# Patient Record
Sex: Female | Born: 1985
Health system: Southern US, Community
[De-identification: ages and names within clinical notes are randomized; demographics above are authoritative.]

## PROBLEM LIST (undated history)

## (undated) ENCOUNTER — Emergency Department (HOSPITAL_BASED_OUTPATIENT_CLINIC_OR_DEPARTMENT_OTHER): Admission: EM | Payer: BC Managed Care – PPO | Source: Home / Self Care

---
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
## 2006-01-20 ENCOUNTER — Ambulatory Visit: Payer: Self-pay | Admitting: Internal Medicine

## 2006-01-20 ENCOUNTER — Ambulatory Visit (HOSPITAL_COMMUNITY): Admission: RE | Admit: 2006-01-20 | Discharge: 2006-01-20 | Payer: Self-pay | Admitting: Cardiology

## 2006-01-22 ENCOUNTER — Ambulatory Visit: Payer: Self-pay | Admitting: Cardiology

## 2006-02-16 ENCOUNTER — Ambulatory Visit: Payer: Self-pay | Admitting: Cardiology

## 2006-02-17 ENCOUNTER — Ambulatory Visit: Payer: Self-pay | Admitting: Cardiology

## 2009-08-01 ENCOUNTER — Ambulatory Visit (HOSPITAL_COMMUNITY): Admission: RE | Admit: 2009-08-01 | Discharge: 2009-08-01 | Payer: Self-pay | Admitting: Obstetrics and Gynecology

## 2010-03-02 HISTORY — PX: CERVICAL BIOPSY  W/ LOOP ELECTRODE EXCISION: SUR135

## 2010-05-19 LAB — CBC
HCT: 41 % (ref 36.0–46.0)
Hemoglobin: 14.6 g/dL (ref 12.0–15.0)
MCHC: 35.7 g/dL (ref 30.0–36.0)
MCV: 92.3 fL (ref 78.0–100.0)
Platelets: 143 10*3/uL — ABNORMAL LOW (ref 150–400)
RBC: 4.44 MIL/uL (ref 3.87–5.11)
RDW: 12.2 % (ref 11.5–15.5)
WBC: 4.4 10*3/uL (ref 4.0–10.5)

## 2010-05-19 LAB — PREGNANCY, URINE: Preg Test, Ur: NEGATIVE

## 2010-07-18 NOTE — Assessment & Plan Note (Signed)
[facility]                              CARDIOLOGY OFFICE NOTE   NAME:MABE, GLENIS MUSOLF                       MRN:          [id number]  DATE:11/27/2005                            DOB:          05-Dec-1985    REASON FOR CONSULTATION:  History of supraventricular tachycardia with  symptomatic palpitations.   HISTORY OF PRESENT ILLNESS:  Ms. Phineas Real is a pleasant 25 year old woman,  presently a sophomore at [employer].  She and her mother are present  today for the evaluation.  Ms. Phineas Real has an apparent history of possible  supraventricular tachycardia diagnosed based on symptomatology and, as best  I can tell, has never been objectively documented on any type of monitoring.  She describes symptoms of rapid palpitations associated with dyspnea and  dizziness, but no frank syncope or chest pain that she noted predominantly  when she was a sophomore in high school playing sports.  She actually played  sports from age 66 on, including soccer and basketball, and never had any  particular symptomatology until she was in high school.  She was apparently  told that she had a heart murmur as a child, but has no clearly documented  history of congenital heart disease based on available information.  She  apparently saw a pediatric cardiologist several years ago who diagnosed her  with possible supraventricular tachycardia and she has not been on any  specific medications for this.  She states that over the last year or so she  has had more frequent episodes occurring with less intense levels of  activity, such as walking across campus or walking up steps.  She describes  a fairly onset in rapid palpitations, but a more gradual reduction in  symptoms.  Her electrocardiogram today shows sinus bradycardia with sinus  arrhythmia and ectopic atrial beats suggesting an ectopic atrial focus.  I  do not have an old tracing for comparison.   PAST MEDICAL HISTORY:  As  outlined above.  She has a history of double  hernia repair in 1994.  She had a blocked salivary gland in 1995 and  removal of a cyst in her neck in 1995.  There is no obvious history of  thyroid disease.   SOCIAL HISTORY:  The patient is single.  She denies any tobacco use or  illicit substance use.  She denies any alcohol use.  She does drink  caffeinated beverages, but has cut this back to 1 daily.   FAMILY HISTORY:  Noncontributory for premature cardiovascular disease or  sudden cardiac death.  Her parents are alive and well and she has a sister  age 14 with no reported major medical problems.   REVIEW OF SYSTEMS:  Covered in the history of the present illness.  She does  have some seasonal allergies.  She reports no major symptomatology except  for the above-described episodes.   MEDICATIONS:  Include oral contraceptives.   EXAMINATION:  Blood pressure is 110/60, heart rate is 58, weight is 139  pounds.  This is a normally-nourished-appearing young woman in no acute distress.  HEENT:  Conjunctivae looks normal.  Oropharynx is clear.  NECK:  Supple without elevated jugular venous pressure.  There are no  carotid bruits.  No thyromegaly or thyroid tenderness is noted.  LUNGS:  Clear without labored breathing.  CARDIAC:  Regular rate and rhythm with a very soft basal systolic murmur  that is intermittent.  No pericardial rub or S3 gallop is noted.  ABDOMEN:  Soft with normoactive bowel sounds.  EXTREMITIES:  No significant pitting edema.  SKIN:  Warm and dry.  NEUROPSYCHIATRIC:  The patient is alert and oriented x3.   IMPRESSION/RECOMMENDATIONS:  1. Longstanding description of palpitations, as discussed above.  Symptoms      have been more frequent recently.  She has not had syncope associated      with this.  Her resting electrocardiogram does show some organized      ectopic atrial activity, although this is not rapid.  She has a soft      systolic murmur, which may be  functional.  At this point, our plan is      to provide a CardioNet monitor and try to capture objectively any      specific dysrhythmia, which may require further intervention.  Will      also proceed with a 2D echocardiogram to evaluate cardiac structure and      function.  For the time-being will not treat with any specific medical      therapy.  I will plan to see her back in the office to review these      results.  2. Further plan is to follow.       Jonelle Sidle, MD     SGM/MedQ  DD:  11/27/2005  DT:  11/29/2005  Job #:  [id number]   cc:   Marcelino Duster L. Vincente Poli, M.D.

## 2010-07-18 NOTE — Assessment & Plan Note (Signed)
[facility]                              CARDIOLOGY OFFICE NOTE   NAME:[name], [name]                       MRN:          [id number]  DATE:01/22/2006                            DOB:          [birth year]    REASON FOR VISIT:  Followup cardiac testing.   HISTORY OF PRESENT ILLNESS:  I saw [name] [name] back in late October.  I  referred her at that time for a level 1 cardiopulmonary exercise test.  This  was just recently performed on 11/21, and interpreted by Dr. Gala Romney.  He  and I discussed the results in detail.  She demonstrated mild functional  limitation, compared to matched sedentary normals, with blunted blood  pressure response to exercise, flattened O2-pulse, and a high heart rate/VO2  relationship with elevated VE/VCO2 slope and low ventilatory threshold.  This was suggestive of some type of circulatory limitation with reduced  stroke volume, although the patient did not have chest pain or  electrocardiographic changes consistent with ischemia and, in fact, this  would be quite unusual, unless she had some type of anomalous coronary  anatomy.  The possibility of pulmonary vascular disease is, however, still  to be considered.  I reviewed this with the patient and her mother today.  What we have decided to do next is a contrasted CT scan of the chest to  exclude the possibility of thromboembolic disease.  She is on oral  contraceptives.  I also note, in speaking with the patient's mother, a  family history in the patient's grandfather of hemochromatosis and possibly  coronary vasospasm and in the patient's grandmother of rheumatoid arthritis.  We will check a sed rate, P-ANCA, C-ANCA, rheumatoid factor and ANA, as well  as a CBC and iron level.  If this information is unrevealing, we could  consider the possibility of an exercise echocardiogram with tricuspid  regurgitant velocities, following stress, to exclude exercise-induced  pulmonary  hypertension and ischemia.   ALLERGIES:  No known drug allergies.   PRESENT MEDICATIONS:  Oral contraceptives.   REVIEW OF SYSTEMS:  As described in History of Present Illness.  She has not  had any cough or hemoptysis.  She has had no syncope.   PHYSICAL EXAMINATION:  VITAL SIGNS:  Blood pressure is 120/75, heart rate is  63, weight is 142 pounds.  LUNGS:  Clear without labored breathing.  CARDIAC EXAM:  Reveals a regular rate and rhythm with soft functional  systolic murmur, no S3 gallop.  Second heart sound is normal.  EXTREMITIES:  Show no significant pitting edema.   IMPRESSION/RECOMMENDATIONS:  1. Dyspnea on exertion.  Level 1 cardiopulmonary stress testing was      abnormal, as discussed above.  We will proceed with a contrasted CT      scan of the chest to exclude the possibility of chronic thromboembolic      disease, and also obtain serologies to investigate possible collagen      vascular disease, as well as iron level and complete blood count, given      her grandfather's history of hemochromatosis.  Based on this      information, we can proceed to      the next step.  2. Further plans to follow.     Jonelle Sidle, MD  Electronically Signed    SGM/MedQ  DD: 01/22/2006  DT: 01/22/2006  Job #: [id number]

## 2010-07-18 NOTE — Assessment & Plan Note (Signed)
[facility]                              CARDIOLOGY OFFICE NOTE   NAME:[name], [name] ADEN                       MRN:          [id number]  DATE:12/25/2005                            DOB:          Oct 16, 1985    REASON FOR VISIT:  Follow up cardiac testing.   HISTORY OF PRESENT ILLNESS:  I saw [name] [name] back in late September.  Her  history is outlined in my previous note.  I referred her for an  echocardiogram, which was normal, revealing an ejection fraction of 60% with  no major valvular abnormalities.  Right ventricular size and function were  described as normal as well.  She wore an event recorder, which did not  document any specific dysrhythmias.  She did have some episodes of sinus  tachycardia.  There was nothing to suggest a clear paroxysmal  supraventricular tachycardia.  I spoke with the patient and her mother again  today about symptoms.  She really seems to relate dyspnea on exertion out of  proportion to level of exercise, particularly based on her age.  Apparently  in the past when she was playing sports she would have occasional episodes  of severe breathlessness and feeling of rapid heart rate, although not on  any regular basis.  Now she has more frequent episodes, although of much  less intensity.  She tells me, for instance, that she can walk up 2 flights  of stairs and is apparently very breathless and had to rest when she gets to  the top.  She is not having any chest pain, and has had no syncope.  She is  not aware of any wheezing or diagnosis of asthma.   ALLERGIES:  No known drug allergies.   PRESENT MEDICATIONS:  Oral contraceptives.   REVIEW OF SYSTEMS:  As described in history of present illness.   PHYSICAL EXAMINATION:  Blood pressure 112/86, heart rate 65, weight 138  pounds.  The patient is in no acute distress.  HEENT:  Conjunctivae and lids are normal.  Oropharynx is clear.  NECK:  Supple without elevated jugular  venous pressure or loud bruits.  No  thyromegaly noted.  LUNGS:  Clear without labored breathing.  CARDIAC:  Exam reveals a regular rate and rhythm with very soft functional  systolic murmur, no S3 gallop.  EXTREMITIES:  Showed no significant pitting edema.  SKIN:  Warm and dry.  NEUROPSYCHIATRIC:  The patient is alert and oriented x3, affect is normal.   IMPRESSION AND RECOMMENDATIONS:  1. History of dyspnea on exertion and palpitations, as outlined above.  No      clear dysrhythmia was noted on event recorder, and heart was      structurally normal by echocardiography.  She has had these symptoms      for quite some time, although now they seem to be more frequent but      less intense.  We talked about a number of issues today, and I have      recommended a cardiopulmonary exercise test to evaluate her a bit more  completely from a cardiopulmonary perspective.  This will be arranged,      and I will have her follow up to      discuss the results.  2. Further plans to follow.     Jonelle Sidle, MD    SGM/MedQ  DD: 12/25/2005  DT: 12/27/2005  Job #: [id number]   cc:   Marcelino Duster L. Vincente Poli, M.D.

## 2010-07-18 NOTE — Assessment & Plan Note (Signed)
[facility]                            CARDIOLOGY OFFICE NOTE   Abbey Chatters LAYANI FORONDA                       MRN:          [id number]  DATE:02/17/2006                            DOB:          Nov 08, 1985    REASON FOR VISIT:  Followup testing.   HISTORY OF PRESENT ILLNESS:  I saw Ms. Mabe back in late November.  Her  history is as outlined in my previous note.  Following that visit, I  scheduled her for a contrasted CT scan of the chest to exclude  significant thromboembolic disease and also serologies to investigate  the possibility of collagen vascular disease and hemochromatosis.  She  had all of her blood work and this was quite reassuring.  Her ANA was  normal, as was her CBC.  Her rheumatoid factor was normal, sed rate was  normal, and iron level was normal.  Chest CT was performed and describes  no large or medium-sized pulmonary emboli and no other significant  abnormalities.  I reviewed this with the patient and her mother today.  She has not had any marked change in symptomatology.  We did discuss the  possibility of an exercise echocardiogram to exclude ischemia (unlikely)  and also to obtain a tricuspid regurgitant velocity following exercise  in an effort to evaluate exercise-induced pulmonary hypertension.   She is planning to do some traveling over the next few weeks and, for  the time being, prefers to hold off on testing and we will plan to see  her back in late January, to go on from there.  In the meanwhile, I have  asked her to do some basic walking and other aerobic activity, to see if  deconditioning is at all related to her symptoms.  She and her mother  were comfortable with this.   ALLERGIES:  No known drug allergies.   PRESENT MEDICATIONS:  Oral contraceptives.   REVIEW OF SYSTEMS:  As described in the History of Present Illness.   EXAMINATION:  Blood pressure 120/70, heart rate 80, weight 141 pounds.  No other  significant changes.   IMPRESSION/RECOMMENDATIONS:  1. History of dyspnea on exertion, as outlined previously.  Her      evaluation has been fairly reassuring with the exception of a      finding of mild functional limitation on the cardiopulmonary stress      testing.  Her CT scan argues against thromboembolic disease and her      serologies and iron level argue against collagen vascular disease      or hemochromatosis.  I do think it would be reasonable to do an      exercise echocardiogram to exclude ischemia and also evaluate for      exercise-induced pulmonary hypertension.  She will plan to return      to the office in January and we will discuss this further.  In the      meanwhile, doing some basic      exercise to see if conditioning is related to her symptomatology.  2. Further plans to follow.  Jonelle Sidle, MD  Electronically Signed    SGM/MedQ  DD: 02/17/2006  DT: 02/17/2006  Job #: [id number]

## 2011-05-26 ENCOUNTER — Other Ambulatory Visit: Payer: Self-pay | Admitting: Obstetrics and Gynecology

## 2012-06-16 ENCOUNTER — Other Ambulatory Visit: Payer: Self-pay | Admitting: Obstetrics and Gynecology

## 2012-08-22 ENCOUNTER — Other Ambulatory Visit: Payer: Self-pay | Admitting: Obstetrics and Gynecology

## 2013-01-30 ENCOUNTER — Other Ambulatory Visit: Payer: Self-pay | Admitting: Obstetrics and Gynecology

## 2013-08-29 ENCOUNTER — Emergency Department (HOSPITAL_COMMUNITY)
Admission: EM | Admit: 2013-08-29 | Discharge: 2013-08-29 | Disposition: A | Discharge status: Home / Self Care | Payer: BC Managed Care – PPO | Attending: Emergency Medicine | Admitting: Emergency Medicine

## 2013-08-29 ENCOUNTER — Encounter (HOSPITAL_COMMUNITY): Payer: Self-pay | Admitting: Emergency Medicine

## 2013-08-29 DIAGNOSIS — R197 Diarrhea, unspecified: Secondary | ICD-10-CM | POA: Insufficient documentation

## 2013-08-29 DIAGNOSIS — R5381 Other malaise: Secondary | ICD-10-CM | POA: Insufficient documentation

## 2013-08-29 DIAGNOSIS — R61 Generalized hyperhidrosis: Secondary | ICD-10-CM | POA: Insufficient documentation

## 2013-08-29 DIAGNOSIS — A049 Bacterial intestinal infection, unspecified: Secondary | ICD-10-CM | POA: Insufficient documentation

## 2013-08-29 DIAGNOSIS — Z3202 Encounter for pregnancy test, result negative: Secondary | ICD-10-CM | POA: Insufficient documentation

## 2013-08-29 DIAGNOSIS — R5383 Other fatigue: Secondary | ICD-10-CM

## 2013-08-29 DIAGNOSIS — R110 Nausea: Secondary | ICD-10-CM | POA: Insufficient documentation

## 2013-08-29 LAB — URINE MICROSCOPIC-ADD ON

## 2013-08-29 LAB — URINALYSIS, ROUTINE W REFLEX MICROSCOPIC
Bilirubin Urine: NEGATIVE
GLUCOSE, UA: NEGATIVE mg/dL
Ketones, ur: NEGATIVE mg/dL
Leukocytes, UA: NEGATIVE
Nitrite: NEGATIVE
PH: 6 (ref 5.0–8.0)
PROTEIN: NEGATIVE mg/dL
Specific Gravity, Urine: 1.008 (ref 1.005–1.030)
Urobilinogen, UA: 0.2 mg/dL (ref 0.0–1.0)

## 2013-08-29 LAB — COMPREHENSIVE METABOLIC PANEL
ALBUMIN: 3 g/dL — AB (ref 3.5–5.2)
ALT: 12 U/L (ref 0–35)
AST: 16 U/L (ref 0–37)
Alkaline Phosphatase: 51 U/L (ref 39–117)
BUN: 11 mg/dL (ref 6–23)
CO2: 17 mEq/L — ABNORMAL LOW (ref 19–32)
CREATININE: 0.85 mg/dL (ref 0.50–1.10)
Calcium: 7.9 mg/dL — ABNORMAL LOW (ref 8.4–10.5)
Chloride: 108 mEq/L (ref 96–112)
GFR calc Af Amer: >90 mL/min (ref >90)
GFR calc non Af Amer: >90 mL/min (ref >90)
Glucose, Bld: 127 mg/dL — ABNORMAL HIGH (ref 70–99)
Potassium: 3.5 mEq/L — ABNORMAL LOW (ref 3.7–5.3)
Sodium: 139 mEq/L (ref 137–147)
TOTAL PROTEIN: 6.1 g/dL (ref 6.0–8.3)
Total Bilirubin: 0.7 mg/dL (ref 0.3–1.2)

## 2013-08-29 LAB — CBC WITH DIFFERENTIAL/PLATELET
BASOS ABS: 0 10*3/uL (ref 0.0–0.1)
Basophils Relative: 0 % (ref 0–1)
Eosinophils Absolute: 0 10*3/uL (ref 0.0–0.7)
Eosinophils Relative: 0 % (ref 0–5)
HEMATOCRIT: 33.7 % — AB (ref 36.0–46.0)
Hemoglobin: 11.4 g/dL — ABNORMAL LOW (ref 12.0–15.0)
LYMPHS ABS: 0.4 10*3/uL — AB (ref 0.7–4.0)
Lymphocytes Relative: 9 % — ABNORMAL LOW (ref 12–46)
MCH: 31.7 pg (ref 26.0–34.0)
MCHC: 33.8 g/dL (ref 30.0–36.0)
MCV: 93.6 fL (ref 78.0–100.0)
Monocytes Absolute: 0.3 10*3/uL (ref 0.1–1.0)
Monocytes Relative: 7 % (ref 3–12)
Neutro Abs: 3.8 10*3/uL (ref 1.7–7.7)
Neutrophils Relative %: 84 % — ABNORMAL HIGH (ref 43–77)
PLATELETS: 107 10*3/uL — AB (ref 150–400)
RBC: 3.6 MIL/uL — ABNORMAL LOW (ref 3.87–5.11)
RDW: 13.1 % (ref 11.5–15.5)
WBC MORPHOLOGY: INCREASED
WBC: 4.5 10*3/uL (ref 4.0–10.5)

## 2013-08-29 LAB — PREGNANCY, URINE: PREG TEST UR: NEGATIVE

## 2013-08-29 MED ORDER — CIPROFLOXACIN HCL 500 MG PO TABS: 500.0000 mg | ORAL_TABLET | Freq: Two times a day (BID) | ORAL | Status: DC

## 2013-08-29 MED ORDER — ONDANSETRON 4 MG PO TBDP: ORAL_TABLET | ORAL | Status: DC

## 2013-08-29 MED ORDER — TRAMADOL HCL 50 MG PO TABS
50.0000 mg | ORAL_TABLET | Freq: Once | ORAL | Status: AC
  Administered 2013-08-29: 50 mg via ORAL
  Filled 2013-08-29: qty 1

## 2013-08-29 MED ORDER — ACETAMINOPHEN 500 MG PO TABS
1000.0000 mg | ORAL_TABLET | Freq: Once | ORAL | Status: AC
  Administered 2013-08-29: 1000 mg via ORAL
  Filled 2013-08-29: qty 2

## 2013-08-29 MED ORDER — SODIUM CHLORIDE 0.9 % IV BOLUS (SEPSIS)
1000.0000 mL | Freq: Once | INTRAVENOUS | Status: AC
  Administered 2013-08-29: 1000 mL via INTRAVENOUS

## 2013-08-29 MED ORDER — TRAMADOL HCL 50 MG PO TABS: 50.0000 mg | ORAL_TABLET | Freq: Four times a day (QID) | ORAL | Status: DC | PRN

## 2013-08-29 NOTE — ED Provider Notes (Signed)
CSN: [id number]     Arrival date & time 08/29/13  0153 History   First MD Initiated Contact with Patient 08/29/13 0210     Chief Complaint  Patient presents with  . Fever     (Consider location/radiation/quality/duration/timing/severity/associated sxs/prior Treatment) HPI Patient returned from [place] on Sunday. She started having diarrhea shortly before leaving. She started having fevers and chills when she arrived back in the [place]. She's had multiple watery stools daily. She's had increase bowel sounds but denies any abdominal pain. She complains of generalized headache especially when her fever spikes but then improves when he goes down. She has no neck pain or stiffness. She has no focal weakness or numbness. She hasn't had a mild cough without any sputum production or shortness of breath. Denies any urinary symptoms. She states that she did use ice cubes and tap water to brush her teeth in [place]. No sick contacts. Mild nausea without any vomiting. History reviewed. No pertinent past medical history. History reviewed. No pertinent past surgical history. No family history on file. History  Substance Use Topics  . Smoking status: Never Smoker   . Smokeless tobacco: Not on file  . Alcohol Use: Yes   OB History   Grav Para Term Preterm Abortions TAB SAB Ect Mult Living                 Review of Systems  Constitutional: Positive for fever, chills, diaphoresis and fatigue.  HENT: Negative for congestion, ear pain and sore throat.   Respiratory: Negative for shortness of breath.   Cardiovascular: Negative for chest pain.  Gastrointestinal: Positive for nausea and diarrhea. Negative for vomiting, abdominal pain, constipation and blood in stool.  Genitourinary: Negative for dysuria, frequency and flank pain.  Musculoskeletal: Negative for back pain, neck pain and neck stiffness.  Skin: Negative for rash and wound.  Neurological: Positive for headaches. Negative for dizziness, weakness,  light-headedness and numbness.  All other systems reviewed and are negative.     Allergies  Review of patient's allergies indicates no known allergies.  Home Medications   Prior to Admission medications   Not on File   BP 141/83  Pulse 109  Temp(Src) 101.2 F (38.4 C) (Oral)  Resp 14  SpO2 100% Physical Exam  Nursing note and vitals reviewed. Constitutional: She is oriented to person, place, and time. She appears well-developed and well-nourished. No distress.  HENT:  Head: Normocephalic and atraumatic.  Nose: Nose normal.  Mouth/Throat: Oropharynx is clear and moist. No oropharyngeal exudate.  Right TM is dull  Eyes: EOM are normal. Pupils are equal, round, and reactive to light.  Neck: Normal range of motion. Neck supple. No tracheal deviation present.  No meningismus  Cardiovascular: Normal rate and regular rhythm.   Pulmonary/Chest: Effort normal and breath sounds normal. No respiratory distress. She has no wheezes. She has no rales.  Abdominal: Soft. She exhibits no distension and no mass. There is no tenderness. There is no rebound and no guarding.  Increased bowel sounds.  Musculoskeletal: Normal range of motion. She exhibits no edema and no tenderness.  No flank tenderness  Lymphadenopathy:    She has no cervical adenopathy.  Neurological: She is alert and oriented to person, place, and time.  Patient is alert and oriented x3 with clear, goal oriented speech. Patient has 5/5 motor in all extremities. Sensation is intact to light touch.   Skin: Skin is warm and dry. No rash noted. No erythema.  Psychiatric: She has a normal  mood and affect. Her behavior is normal.    ED Course  Procedures (including critical care time) Labs Review Labs Reviewed  CBC WITH DIFFERENTIAL - Abnormal; Notable for the following:    RBC 3.60 (*)    Hemoglobin 11.4 (*)    HCT 33.7 (*)    All other components within normal limits  COMPREHENSIVE METABOLIC PANEL  URINALYSIS, ROUTINE  W REFLEX MICROSCOPIC  PREGNANCY, URINE    Imaging Review No results found.   EKG Interpretation None      MDM   Final diagnoses:  None    Febrile illness likely gastrointestinal in origin. We'll treat symptomatically and with IV fluids.  Patient's symptoms have improved with IV fluids. She remained hemodynamically stable in the emergency department. Her abdomen is soft and nontender. Likely patient has acquired some type of food born illness. We'll treat with short course of ciprofloxacin and have given return precautions. I have a little suspicion for encephalitis/meningitis. She has a normal neurologic exam and headaches seems to be closely tied to fever.  Loren Raceravid Yelverton, MD 08/29/13 [phone]

## 2013-08-29 NOTE — ED Notes (Signed)
Pt placed in gown and in bed. PT monitored by bp cuff and pulse ox.

## 2013-08-29 NOTE — Discharge Instructions (Signed)
You likely had a foodborne bacteria causing your symptoms. Make sure you wash her hands well. You may alternate Tylenol and Motrin for fever control. Take the antibiotic as prescribed. Return immediately to the emergency department for worsening abdominal pain, headache, neck pain, neck stiffness, persistent vomiting or for any concerns

## 2013-08-29 NOTE — ED Notes (Signed)
Pt. reports fever onset Sunday afternoon with headache , occasional nausea and diarrhea / dry cough .

## 2013-10-02 ENCOUNTER — Other Ambulatory Visit: Payer: Self-pay | Admitting: Obstetrics and Gynecology

## 2013-10-03 LAB — CYTOLOGY - PAP

## 2014-10-22 ENCOUNTER — Other Ambulatory Visit: Payer: Self-pay | Admitting: Obstetrics and Gynecology

## 2014-10-24 LAB — CYTOLOGY - PAP

## 2016-08-31 DIAGNOSIS — M5137 Other intervertebral disc degeneration, lumbosacral region: Secondary | ICD-10-CM | POA: Diagnosis not present

## 2016-08-31 DIAGNOSIS — M791 Myalgia: Secondary | ICD-10-CM | POA: Diagnosis not present

## 2016-08-31 DIAGNOSIS — M545 Low back pain: Secondary | ICD-10-CM | POA: Diagnosis not present

## 2016-08-31 DIAGNOSIS — M5127 Other intervertebral disc displacement, lumbosacral region: Secondary | ICD-10-CM | POA: Diagnosis not present

## 2016-08-31 DIAGNOSIS — M6281 Muscle weakness (generalized): Secondary | ICD-10-CM | POA: Diagnosis not present

## 2016-09-01 DIAGNOSIS — M5137 Other intervertebral disc degeneration, lumbosacral region: Secondary | ICD-10-CM | POA: Diagnosis not present

## 2016-09-01 DIAGNOSIS — M5127 Other intervertebral disc displacement, lumbosacral region: Secondary | ICD-10-CM | POA: Diagnosis not present

## 2016-09-01 DIAGNOSIS — M791 Myalgia: Secondary | ICD-10-CM | POA: Diagnosis not present

## 2016-09-01 DIAGNOSIS — M545 Low back pain: Secondary | ICD-10-CM | POA: Diagnosis not present

## 2016-09-01 DIAGNOSIS — M6281 Muscle weakness (generalized): Secondary | ICD-10-CM | POA: Diagnosis not present

## 2016-09-04 DIAGNOSIS — M6281 Muscle weakness (generalized): Secondary | ICD-10-CM | POA: Diagnosis not present

## 2016-09-04 DIAGNOSIS — M545 Low back pain: Secondary | ICD-10-CM | POA: Diagnosis not present

## 2016-09-04 DIAGNOSIS — M5127 Other intervertebral disc displacement, lumbosacral region: Secondary | ICD-10-CM | POA: Diagnosis not present

## 2016-09-04 DIAGNOSIS — M791 Myalgia: Secondary | ICD-10-CM | POA: Diagnosis not present

## 2016-09-04 DIAGNOSIS — M5137 Other intervertebral disc degeneration, lumbosacral region: Secondary | ICD-10-CM | POA: Diagnosis not present

## 2016-09-07 DIAGNOSIS — M5137 Other intervertebral disc degeneration, lumbosacral region: Secondary | ICD-10-CM | POA: Diagnosis not present

## 2016-09-07 DIAGNOSIS — M791 Myalgia: Secondary | ICD-10-CM | POA: Diagnosis not present

## 2016-09-07 DIAGNOSIS — M6281 Muscle weakness (generalized): Secondary | ICD-10-CM | POA: Diagnosis not present

## 2016-09-07 DIAGNOSIS — M5127 Other intervertebral disc displacement, lumbosacral region: Secondary | ICD-10-CM | POA: Diagnosis not present

## 2016-09-09 DIAGNOSIS — M6281 Muscle weakness (generalized): Secondary | ICD-10-CM | POA: Diagnosis not present

## 2016-09-09 DIAGNOSIS — M5127 Other intervertebral disc displacement, lumbosacral region: Secondary | ICD-10-CM | POA: Diagnosis not present

## 2016-09-09 DIAGNOSIS — M5137 Other intervertebral disc degeneration, lumbosacral region: Secondary | ICD-10-CM | POA: Diagnosis not present

## 2016-09-09 DIAGNOSIS — M791 Myalgia: Secondary | ICD-10-CM | POA: Diagnosis not present

## 2016-09-11 DIAGNOSIS — M6281 Muscle weakness (generalized): Secondary | ICD-10-CM | POA: Diagnosis not present

## 2016-09-11 DIAGNOSIS — M5137 Other intervertebral disc degeneration, lumbosacral region: Secondary | ICD-10-CM | POA: Diagnosis not present

## 2016-09-11 DIAGNOSIS — M5127 Other intervertebral disc displacement, lumbosacral region: Secondary | ICD-10-CM | POA: Diagnosis not present

## 2016-09-11 DIAGNOSIS — M791 Myalgia: Secondary | ICD-10-CM | POA: Diagnosis not present

## 2016-09-14 DIAGNOSIS — M5137 Other intervertebral disc degeneration, lumbosacral region: Secondary | ICD-10-CM | POA: Diagnosis not present

## 2016-09-14 DIAGNOSIS — M5127 Other intervertebral disc displacement, lumbosacral region: Secondary | ICD-10-CM | POA: Diagnosis not present

## 2016-09-14 DIAGNOSIS — M791 Myalgia: Secondary | ICD-10-CM | POA: Diagnosis not present

## 2016-09-14 DIAGNOSIS — M6281 Muscle weakness (generalized): Secondary | ICD-10-CM | POA: Diagnosis not present

## 2016-10-02 DIAGNOSIS — M6281 Muscle weakness (generalized): Secondary | ICD-10-CM | POA: Diagnosis not present

## 2016-10-02 DIAGNOSIS — M5127 Other intervertebral disc displacement, lumbosacral region: Secondary | ICD-10-CM | POA: Diagnosis not present

## 2016-10-02 DIAGNOSIS — M791 Myalgia: Secondary | ICD-10-CM | POA: Diagnosis not present

## 2016-10-02 DIAGNOSIS — M5137 Other intervertebral disc degeneration, lumbosacral region: Secondary | ICD-10-CM | POA: Diagnosis not present

## 2016-10-05 DIAGNOSIS — M791 Myalgia: Secondary | ICD-10-CM | POA: Diagnosis not present

## 2016-10-05 DIAGNOSIS — M5137 Other intervertebral disc degeneration, lumbosacral region: Secondary | ICD-10-CM | POA: Diagnosis not present

## 2016-10-05 DIAGNOSIS — M6281 Muscle weakness (generalized): Secondary | ICD-10-CM | POA: Diagnosis not present

## 2016-10-05 DIAGNOSIS — M5127 Other intervertebral disc displacement, lumbosacral region: Secondary | ICD-10-CM | POA: Diagnosis not present

## 2016-10-08 DIAGNOSIS — M5127 Other intervertebral disc displacement, lumbosacral region: Secondary | ICD-10-CM | POA: Diagnosis not present

## 2016-10-08 DIAGNOSIS — M5137 Other intervertebral disc degeneration, lumbosacral region: Secondary | ICD-10-CM | POA: Diagnosis not present

## 2016-10-08 DIAGNOSIS — M791 Myalgia: Secondary | ICD-10-CM | POA: Diagnosis not present

## 2016-10-08 DIAGNOSIS — M6281 Muscle weakness (generalized): Secondary | ICD-10-CM | POA: Diagnosis not present

## 2016-10-12 DIAGNOSIS — M5127 Other intervertebral disc displacement, lumbosacral region: Secondary | ICD-10-CM | POA: Diagnosis not present

## 2016-10-12 DIAGNOSIS — M791 Myalgia: Secondary | ICD-10-CM | POA: Diagnosis not present

## 2016-10-12 DIAGNOSIS — M5137 Other intervertebral disc degeneration, lumbosacral region: Secondary | ICD-10-CM | POA: Diagnosis not present

## 2016-10-12 DIAGNOSIS — M6281 Muscle weakness (generalized): Secondary | ICD-10-CM | POA: Diagnosis not present

## 2016-12-14 DIAGNOSIS — Z01419 Encounter for gynecological examination (general) (routine) without abnormal findings: Secondary | ICD-10-CM | POA: Diagnosis not present

## 2016-12-14 DIAGNOSIS — Z6828 Body mass index (BMI) 28.0-28.9, adult: Secondary | ICD-10-CM | POA: Diagnosis not present

## 2017-02-17 DIAGNOSIS — N921 Excessive and frequent menstruation with irregular cycle: Secondary | ICD-10-CM | POA: Diagnosis not present

## 2017-04-15 ENCOUNTER — Encounter: Payer: Self-pay | Admitting: Physician Assistant

## 2017-04-15 ENCOUNTER — Ambulatory Visit: Payer: BLUE CROSS/BLUE SHIELD | Admitting: Physician Assistant

## 2017-04-15 VITALS — BP 124/76 | HR 81 | Temp 97.7°F | Resp 18 | Ht 69.0 in | Wt 182.4 lb

## 2017-04-15 DIAGNOSIS — Z0000 Encounter for general adult medical examination without abnormal findings: Secondary | ICD-10-CM

## 2017-04-15 DIAGNOSIS — E559 Vitamin D deficiency, unspecified: Secondary | ICD-10-CM

## 2017-04-15 DIAGNOSIS — Z13220 Encounter for screening for lipoid disorders: Secondary | ICD-10-CM

## 2017-04-15 DIAGNOSIS — Z1329 Encounter for screening for other suspected endocrine disorder: Secondary | ICD-10-CM

## 2017-04-15 DIAGNOSIS — Z6826 Body mass index (BMI) 26.0-26.9, adult: Secondary | ICD-10-CM

## 2017-04-15 DIAGNOSIS — Z79899 Other long term (current) drug therapy: Secondary | ICD-10-CM

## 2017-04-15 NOTE — Progress Notes (Signed)
Complete Physical  Assessment and Plan: [name] [name] was seen today for annual exam.  DiagnoseBarry [name] and all orders for this visit:  BMI 26.0-26.9,adult -     TSH  Vitamin D deficiency -     VITAMIN D 25 Hydroxy (Vit-D Deficiency, Fractures)  Routine general medical examination at a health care facility  Screening cholesterol level -     Lipid panel  Medication management -     CBC with Differential/Platelet -     BASIC METABOLIC PANEL WITH GFR -     Hepatic function panel  Screening for thyroid disorder -     TSH    Discussed med's effects and SE's. Screening labs and tests as requested with regular follow-up as recommended. Over 40 minutes of exam, counseling, chart review, and complex, high level critical decision making was performed this visit.   HPI  32 y.o. female  presents for a complete physical and follow up as new patient without concerns. Her husband, [name] [name] comes here as a patient, newly married.   Her blood pressure has been controlled at home, today their BP is BP: 124/76 She does not workout. She denies chest pain, shortness of breath, dizziness.  She has allergy history, on allegra year round that helps.   Lab Results  Component Value Date   GFRNONAA >90 08/29/2013   BMI is Body mass index is 26.94 kg/m., she is working on diet and exercise. She admits to weight gain with recent dating/marriage. Has lower back pain, going to see chiropractor.   Wt Readings from Last 3 Encounters:  04/15/17 182 lb 6.4 oz (82.7 kg)     Current Medications:  No current outpatient medications on file prior to visit.   No current facility-administered medications on file prior to visit.    Allergies:  No Known Allergies Medical History:  She does not have a problem list on file.  Health Maintenance:   Tetanus: August 2017 Pneumovax: Prevnar 13:  Flu vaccine: declines Zostavax: Has completed HPV  LMP: IUD Pap: Dr. Perlie [name] MGM:  DEXA: Colonoscopy: EGD:  Patient  Care Team: [name] [name], William, MD as PCP - General (Internal Medicine)  Surgical History:  She has a past surgical history that includes Cervical biopsy w/ loop electrode excision (2012) and Hernia repair (1996). Family History:  Herfamily history includes Asthma in her sister; Hypertension in her mother; Kidney Stones in her mother and sister; Migraines in her mother. Social History:  She reports that  has never smoked. she has never used smokeless tobacco. She reports that she drinks alcohol. She reports that she does not use drugs.  Review of Systems: Review of Systems  Constitutional: Negative.  Negative for chills and fever.  HENT: Positive for congestion (improving). Negative for ear pain, sinus pain and sore throat.   Eyes: Negative.   Respiratory: Negative.   Cardiovascular: Negative.   Gastrointestinal: Negative.   Genitourinary: Negative.   Musculoskeletal: Positive for back pain. Negative for falls, joint pain, myalgias and neck pain.  Skin: Negative.   Neurological: Negative.   Endo/Heme/Allergies: Negative.   Psychiatric/Behavioral: Negative.     Physical Exam: Estimated body mass index is 26.94 kg/m as calculated from the following:   Height as of this encounter: 5\' 9"  (1.753 m).   Weight as of this encounter: 182 lb 6.4 oz (82.7 kg). BP 124/76   Pulse 81   Temp 97.7 F (36.5 C)   Resp 18   Ht 5\' 9"  (1.753 m)   Wt 182 lb 6.4  oz (82.7 kg)   SpO2 98%   BMI 26.94 kg/m  General Appearance: Well nourished, in no apparent distress.  Eyes: PERRLA, EOMs, conjunctiva no swelling or erythema, normal fundi and vessels.  Sinuses: No Frontal/maxillary tenderness  ENT/Mouth: Ext aud canals clear, normal light reflex with TMs without erythema, bulging. Good dentition. No erythema, swelling, or exudate on post pharynx. Tonsils not swollen or erythematous. Hearing normal.  Neck: Supple, thyroid normal. No bruits  Respiratory: Respiratory effort normal, BS equal bilaterally  without rales, rhonchi, wheezing or stridor.  Cardio: RRR without murmurs, rubs or gallops. Brisk peripheral pulses without edema.  Chest: symmetric, with normal excursions and percussion.  Breasts: defer GYN Abdomen: Soft, nontender, no guarding, rebound, hernias, masses, or organomegaly.  Lymphatics: Non tender without lymphadenopathy.  Genitourinary: defer GYN Musculoskeletal: Full ROM all peripheral extremities,5/5 strength, and normal gait.  Skin: Warm, dry without rashes, lesions, ecchymosis. Neuro: Cranial nerves intact, reflexes equal bilaterally. Normal muscle tone, no cerebellar symptoms. Sensation intact.  Psych: Awake and oriented X 3, normal affect, Insight and Judgment appropriate.   EKG: defer  Quentin Mulling 11:31 AM [facility]

## 2017-04-15 NOTE — Patient Instructions (Addendum)
Vitamin D goal is between 60-80  Please make sure that you are taking your Vitamin D as directed.   It is very important as a natural anti-inflammatory   helping hair, skin, and nails, as well as reducing stroke and heart attack risk.   It helps your bones and helps with mood.  We want you on at least 5000 IU daily  It also decreases numerous cancer risks so please take it as directed.   Low Vit D is associated with a 200-300% higher risk for CANCER   and 200-300% higher risk for HEART   ATTACK  &  STROKE.    .....................................Marland Kitchen  It is also associated with higher death rate at younger ages,   autoimmune diseases like Rheumatoid arthritis, Lupus, Multiple Sclerosis.     Also many other serious conditions, like depression, Alzheimer's  Dementia, infertility, muscle aches, fatigue, fibromyalgia - just to name a few.  +++++++++++++++++++  Can get liquid vitamin D from [place]  OR here in [place] at  [facility] alternatives [address], [place], [place] [address] Or you can try earth fare     Back pain Rehab Ask your health care provider which exercises are safe for you. Do exercises exactly as told by your health care provider and adjust them as directed. It is normal to feel mild stretching, pulling, tightness, or discomfort as you do these exercises, but you should stop right away if you feel sudden pain or your pain gets worse.Do not begin these exercises until told by your health care provider. Stretching and range of motion exercises These exercises warm up your muscles and joints and improve the movement and flexibility of your hips and your back. These exercises also help to relieve pain, numbness, and tingling. Exercise A: Sciatic nerve glide 1. Sit in a chair with your head facing down toward your chest. Place your hands behind your back. Let your shoulders slump forward. 2. Slowly straighten one of your knees while you tilt your head back as if you  are looking toward the ceiling. Only straighten your leg as far as you can without making your symptoms worse. 3. Hold for __________ seconds. 4. Slowly return to the starting position. 5. Repeat with your other leg. Repeat __________ times. Complete this exercise __________ times a day. Exercise B: Knee to chest with hip adduction and internal rotation  1. Lie on your back on a firm surface with both legs straight. 2. Bend one of your knees and move it up toward your chest until you feel a gentle stretch in your lower back and buttock. Then, move your knee toward the shoulder that is on the opposite side from your leg. ? Hold your leg in this position by holding onto the front of your knee. 3. Hold for __________ seconds. 4. Slowly return to the starting position. 5. Repeat with your other leg. Repeat __________ times. Complete this exercise __________ times a day. Exercise C: Prone extension on elbows  1. Lie on your abdomen on a firm surface. A bed may be too soft for this exercise. 2. Prop yourself up on your elbows. 3. Use your arms to help lift your chest up until you feel a gentle stretch in your abdomen and your lower back. ? This will place some of your body weight on your elbows. If this is uncomfortable, try stacking pillows under your chest. ? Your hips should stay down, against the surface that you are lying on. Keep your hip and back muscles  relaxed. 4. Hold for __________ seconds. 5. Slowly relax your upper body and return to the starting position. Repeat __________ times. Complete this exercise __________ times a day. Strengthening exercises These exercises build strength and endurance in your back. Endurance is the ability to use your muscles for a long time, even after they get tired. Exercise D: Pelvic tilt 1. Lie on your back on a firm surface. Bend your knees and keep your feet flat. 2. Tense your abdominal muscles. Tip your pelvis up toward the ceiling and flatten  your lower back into the floor. ? To help with this exercise, you may place a small towel under your lower back and try to push your back into the towel. 3. Hold for __________ seconds. 4. Let your muscles relax completely before you repeat this exercise. Repeat __________ times. Complete this exercise __________ times a day. Exercise E: Alternating arm and leg raises  1. Get on your hands and knees on a firm surface. If you are on a hard floor, you may want to use padding to cushion your knees, such as an exercise mat. 2. Line up your arms and legs. Your hands should be below your shoulders, and your knees should be below your hips. 3. Lift your left leg behind you. At the same time, raise your right arm and straighten it in front of you. ? Do not lift your leg higher than your hip. ? Do not lift your arm higher than your shoulder. ? Keep your abdominal and back muscles tight. ? Keep your hips facing the ground. ? Do not arch your back. ? Keep your balance carefully, and do not hold your breath. 4. Hold for __________ seconds. 5. Slowly return to the starting position and repeat with your right leg and your left arm. Repeat __________ times. Complete this exercise __________ times a day. Posture and body mechanics  Body mechanics refers to the movements and positions of your body while you do your daily activities. Posture is part of body mechanics. Good posture and healthy body mechanics can help to relieve stress in your body's tissues and joints. Good posture means that your spine is in its natural S-curve position (your spine is neutral), your shoulders are pulled back slightly, and your head is not tipped forward. The following are general guidelines for applying improved posture and body mechanics to your everyday activities. Standing   When standing, keep your spine neutral and your feet about hip-width apart. Keep a slight bend in your knees. Your ears, shoulders, and hips should  line up.  When you do a task in which you stand in one place for a long time, place one foot up on a stable object that is 2-4 inches (5-10 cm) high, such as a footstool. This helps keep your spine neutral. Sitting   When sitting, keep your spine neutral and keep your feet flat on the floor. Use a footrest, if necessary, and keep your thighs parallel to the floor. Avoid rounding your shoulders, and avoid tilting your head forward.  When working at a desk or a computer, keep your desk at a height where your hands are slightly lower than your elbows. Slide your chair under your desk so you are close enough to maintain good posture.  When working at a computer, place your monitor at a height where you are looking straight ahead and you do not have to tilt your head forward or downward to look at the screen. Resting   When lying  down and resting, avoid positions that are most painful for you.  If you have pain with activities such as sitting, bending, stooping, or squatting (flexion-based activities), lie in a position in which your body does not bend very much. For example, avoid curling up on your side with your arms and knees near your chest (fetal position).  If you have pain with activities such as standing for a long time or reaching with your arms (extension-based activities), lie with your spine in a neutral position and bend your knees slightly. Try the following positions: ? Lying on your side with a pillow between your knees. ? Lying on your back with a pillow under your knees. Lifting   When lifting objects, keep your feet at least shoulder-width apart and tighten your abdominal muscles.  Bend your knees and hips and keep your spine neutral. It is important to lift using the strength of your legs, not your back. Do not lock your knees straight out.  Always ask for help to lift heavy or awkward objects. This information is not intended to replace advice given to you by your health  care provider. Make sure you discuss any questions you have with your health care provider. Document Released: 02/16/2005 Document Revised: 10/24/2015 Document Reviewed: 11/02/2014 Elsevier Interactive Patient Education  2018 [employer]   8 Critical Weight-Loss Tips That Aren't Diet and Exercise  1. STARVE THE DISTRACTIONS  All too often when we eat, we're also multitasking: watching TV, answering emails, scrolling through social media. These habits are detrimental to having a strong, clear, healthy relationship with food, and they can hinder our ability to make dietary changes.  In order to truly focus on what you're eating, how much you're eating, why you're eating those specific foods and, most importantly, how those foods make you feel, you need to starve the distractions. That means when you eat, just eat. Focus on your food, the process it went through to end up on your plate, where it came from and how it nourishes you. With this technique, you're more likely to finish a meal feeling satiated.  2.  CONSIDER WHAT YOU'RE NOT WILLING TO DO  This might sound counterintuitive, but it can help provide a "why" when motivation is waning. Declare, in writing, what you are unwilling to do, for example "I am unwilling to be the old dad who cannot play sports with my children".  So consider what you're not willing to accept, write it down, and keep it at the ready.  3.  STOP LABELING FOOD "GOOD" AND "BAD"  You've probably heard someone say they ate something "bad." Maybe you've even said it yourself.  The trouble with 'bad' foods isn't that they'll send you to the grave after a bite or two. The trouble comes when we eat excessive portions of really calorie-dense foods meal after meal, day after day.  Instead of labeling foods as good or bad, think about which foods you can eat a lot of, and which ones you should just eat a little of. Then, plan ways to eat the foods you really like in  portions that fit with your overall goals. A good example of this would be having a slice of pizza alongside a club salad with chicken breast, avocado and a bit of dressing. This is vastly different than 3 slices of pizza, 4 breadsticks with cheese sauce and half of a liter of regular soda.  4.  BRUSH YOUR TEETH AFTER YOU EAT  Getting your  mindset in order is important, but sometimes small habits can make a big difference. After eating, you still have the taste of food in their mouth, which often causes people to eat more even if they are full or engage in a nibble or two of dessert.  Brushing your teeth will remove the taste of food from your mouth, and the clean, minty freshness will serve as a cue that mealtime is over.  5.  FOCUS ON CROWDING NOT CUTTING  The most common first step during 'dieting' is to cut. We cut our portion sizes down, we cut out 'bad' foods, we cut out entire food groups. This act of cutting puts [place] and our minds into scarcity mode.  When something is off-limits, even if you're able to avoid it for a while, you could end up bingeing on it later because you've gone so long without it. So, instead of cutting, focus on crowding. If you crowd your plate and fill it up with more foods like veggies and protein, it simply allows less room for the other stuff. In other words, shift your focus away from what you can't eat, and celebrate the foods that will help you reach your goals.  6.  TAKE TRACKING A STEP FURTHER  Track what you eat, when you ate it, how much you ate and how that food made you feel. Being completely honest with yourself and writing down every single thing that passes through your lips will help you start to notice that maybe you actually do snack, possibly take in more sugar than you thought, eat when you're bored rather than just hungry or maybe that you have a habit of snacking before bed while watching TV.  The difference from simply tracking your food intake is  you're taking into account how food makes you feel, as well as what you're doing while you're eating. This is about becoming more mindful of what, when and why you eat.  7.  PRIORITIZE GOOD SLEEP  One of the strongest risk factors for being overweight is poor sleep. When you're feeling tired, you're more likely to choose unhealthy comfort foods and to skip your workout. Additionally, sleep deprivation may slow down your metabolism. Burnett Kanaris! Therefore, sleeping 7-8 hours per night can help with weight loss without having to change your diet or increase your physical activity. And if you feel you snore and still wake up tired, talk with me about sleep apnea.  8.  SET ASIDE TIME TO DISCONNECT  Just get out there. Disconnect from the electronics and connect to the elements. Not only will this help reduce stress (a major factor in weight gain) by giving your mind a break from the constant stimulation we've all become so accustomed to, but it may also reprogram your brain to connect with yourself and what you're feeling.

## 2017-04-16 LAB — HEPATIC FUNCTION PANEL
AG Ratio: 1.3 (calc) (ref 1.0–2.5)
ALKALINE PHOSPHATASE (APISO): 67 U/L (ref 33–115)
ALT: 16 U/L (ref 6–29)
AST: 20 U/L (ref 10–30)
Albumin: 4.6 g/dL (ref 3.6–5.1)
BILIRUBIN INDIRECT: 0.5 mg/dL (ref 0.2–1.2)
Bilirubin, Direct: 0.1 mg/dL (ref 0.0–0.2)
GLOBULIN: 3.5 g/dL (ref 1.9–3.7)
TOTAL PROTEIN: 8.1 g/dL (ref 6.1–8.1)
Total Bilirubin: 0.6 mg/dL (ref 0.2–1.2)

## 2017-04-16 LAB — CBC WITH DIFFERENTIAL/PLATELET
BASOS PCT: 0.3 %
Basophils Absolute: 20 cells/uL (ref 0–200)
Eosinophils Absolute: 109 cells/uL (ref 15–500)
Eosinophils Relative: 1.6 %
HCT: 40.8 % (ref 35.0–45.0)
Hemoglobin: 14.1 g/dL (ref 11.7–15.5)
Lymphs Abs: 2258 cells/uL (ref 850–3900)
MCH: 30.7 pg (ref 27.0–33.0)
MCHC: 34.6 g/dL (ref 32.0–36.0)
MCV: 88.9 fL (ref 80.0–100.0)
MPV: 12 fL (ref 7.5–12.5)
Monocytes Relative: 5.6 %
NEUTROS PCT: 59.3 %
Neutro Abs: 4032 cells/uL (ref 1500–7800)
PLATELETS: 233 10*3/uL (ref 140–400)
RBC: 4.59 10*6/uL (ref 3.80–5.10)
RDW: 12.7 % (ref 11.0–15.0)
TOTAL LYMPHOCYTE: 33.2 %
WBC mixed population: 381 cells/uL (ref 200–950)
WBC: 6.8 10*3/uL (ref 3.8–10.8)

## 2017-04-16 LAB — BASIC METABOLIC PANEL WITH GFR
BUN: 12 mg/dL (ref 7–25)
CHLORIDE: 108 mmol/L (ref 98–110)
CO2: 24 mmol/L (ref 20–32)
Calcium: 9.7 mg/dL (ref 8.6–10.2)
Creat: 1 mg/dL (ref 0.50–1.10)
GFR, Est African American: 88 mL/min/{1.73_m2} (ref >=60)
GFR, Est Non African American: 76 mL/min/{1.73_m2} (ref >=60)
GLUCOSE: 84 mg/dL (ref 65–99)
POTASSIUM: 4.6 mmol/L (ref 3.5–5.3)
Sodium: 138 mmol/L (ref 135–146)

## 2017-04-16 LAB — LIPID PANEL
CHOLESTEROL: 170 mg/dL (ref <200)
HDL: 45 mg/dL — ABNORMAL LOW (ref >50)
LDL Cholesterol (Calc): 108 mg/dL (calc) — ABNORMAL HIGH
Non-HDL Cholesterol (Calc): 125 mg/dL (calc) (ref <130)
Total CHOL/HDL Ratio: 3.8 (calc) (ref <5.0)
Triglycerides: 78 mg/dL (ref <150)

## 2017-04-16 LAB — TSH: TSH: 0.88 m[IU]/L

## 2017-04-16 LAB — VITAMIN D 25 HYDROXY (VIT D DEFICIENCY, FRACTURES): Vit D, 25-Hydroxy: 37 ng/mL (ref 30–100)

## 2018-01-04 DIAGNOSIS — Z6826 Body mass index (BMI) 26.0-26.9, adult: Secondary | ICD-10-CM | POA: Diagnosis not present

## 2018-01-04 DIAGNOSIS — Z01419 Encounter for gynecological examination (general) (routine) without abnormal findings: Secondary | ICD-10-CM | POA: Diagnosis not present

## 2018-04-25 NOTE — Progress Notes (Signed)
Complete Physical  Assessment and Plan:  BMI 26.0-26.9,adult  Overweight  - long discussion about weight loss, diet, and exercise -recommended diet heavy in fruits and veggies and low in animal meats, cheeses, and dairy products  Vitamin D deficiency -     VITAMIN D 25 Hydroxy (Vit-D Deficiency, Fractures)  Routine general medical examination at a health care facility 1 year  Medication management -     CBC with Differential/Platelet -     COMPLETE METABOLIC PANEL WITH GFR -     Magnesium  Screening cholesterol level -     Lipid panel  Screening for thyroid disorder -     TSH  Screening for hematuria or proteinuria -     Urinalysis, Routine w reflex microscopic -     Microalbumin / creatinine urine ratio  Screening, anemia, deficiency, iron -     Iron,Total/Total Iron Binding Cap -     Vitamin B12  Acute pain of right knee Given information about patellafemoral syndome If not better can refer to ortho   Discussed med's effects and SE's. Screening labs and tests as requested with regular follow-up as recommended. Over 40 minutes of exam, counseling, chart review, and complex, high level critical decision making was performed this visit.   HPI  33 y.o. female  presents for a complete physical and follow up.  Husband is Ray.   She has right knee pain x 2-3 weeks, pain is constant but worse with bending, going up stairs, has popping with discomfort. Some discomfort with turning that knee.  She has been exercising more, no injury. Has tried aleve, tylenol, heat/ice, helps at night for sleeping. No numbness/tingling in leg, pain down the leg, no hip or back pain.   Travel agent.   Her blood pressure has been controlled at home, today their BP is BP: 118/74 She does workout. She denies chest pain, shortness of breath, dizziness.  She has allergy history, on allegra year round that helps.   Lab Results  Component Value Date   GFRNONAA 76 04/15/2017   BMI is Body  mass index is 26.52 kg/m., she is working on diet and exercise.  Has lower back pain, going to see chiropractor.  Wt Readings from Last 3 Encounters:  04/26/18 177 lb (80.3 kg)  04/15/17 182 lb 6.4 oz (82.7 kg)     Current Medications:  No current outpatient medications on file prior to visit.   No current facility-administered medications on file prior to visit.    Allergies:  No Known Allergies   Medical History:  She does not have a problem list on file.  Health Maintenance:   Tetanus: August 2017 Pneumovax: Prevnar 13:  Flu vaccine: declines Zostavax: Has completed HPV  LMP: IUD Pap: Dr. Perlie Gold MGM:  DEXA: Colonoscopy: EGD:  Patient Care Team: Lucky Cowboy, MD as PCP - General (Internal Medicine)  Surgical History:  She has a past surgical history that includes Cervical biopsy w/ loop electrode excision (2012) and Hernia repair (1996). Family History:  Herfamily history includes Asthma in her sister; Hypertension in her mother; Kidney Stones in her mother and sister; Migraines in her mother. Social History:  She reports that she has never smoked. She has never used smokeless tobacco. She reports current alcohol use. She reports that she does not use drugs.  Review of Systems: Review of Systems  Constitutional: Negative.   HENT: Negative.   Eyes: Negative.   Respiratory: Negative.   Cardiovascular: Negative.   Gastrointestinal: Negative.  Genitourinary: Negative.   Musculoskeletal: Positive for joint pain.  Skin: Negative.     Physical Exam: Estimated body mass index is 26.52 kg/m as calculated from the following:   Height as of this encounter: 5' 8.5" (1.74 m).   Weight as of this encounter: 177 lb (80.3 kg). BP 118/74   Pulse 81   Temp 98.1 F (36.7 C)   Ht 5' 8.5" (1.74 m)   Wt 177 lb (80.3 kg)   LMP 04/02/2018   SpO2 98%   BMI 26.52 kg/m  General Appearance: Well nourished, in no apparent distress.  Eyes: PERRLA, EOMs, conjunctiva  no swelling or erythema, normal fundi and vessels.  Sinuses: No Frontal/maxillary tenderness  ENT/Mouth: Ext aud canals clear, normal light reflex with TMs without erythema, bulging. Good dentition. No erythema, swelling, or exudate on post pharynx. Tonsils not swollen or erythematous. Hearing normal.  Neck: Supple, thyroid normal. No bruits  Respiratory: Respiratory effort normal, BS equal bilaterally without rales, rhonchi, wheezing or stridor.  Cardio: RRR without murmurs, rubs or gallops. Brisk peripheral pulses without edema.  Chest: symmetric, with normal excursions and percussion.  Breasts: defer GYN Abdomen: Soft, nontender, no guarding, rebound, hernias, masses, or organomegaly.  Lymphatics: Non tender without lymphadenopathy.  Genitourinary: defer GYN Musculoskeletal: Full ROM all peripheral extremities,5/5 strength, and normal gait. Patient is able to ambulate well.   Gait is not  antalgic. right knee with crepitus and tenderness noted anterior knee, patella laxity noted, no effusion, ACL stable, MCL stable, LCL stable and McMurray's negative Skin: Warm, dry without rashes, lesions, ecchymosis. Neuro: Cranial nerves intact, reflexes equal bilaterally. Normal muscle tone, no cerebellar symptoms. Sensation intact.  Psych: Awake and oriented X 3, normal affect, Insight and Judgment appropriate.   EKG: defer  Quentin Mulling 11:00 AM [facility]

## 2018-04-26 ENCOUNTER — Encounter: Payer: Self-pay | Admitting: Physician Assistant

## 2018-04-26 ENCOUNTER — Ambulatory Visit (INDEPENDENT_AMBULATORY_CARE_PROVIDER_SITE_OTHER): Payer: BLUE CROSS/BLUE SHIELD | Admitting: Physician Assistant

## 2018-04-26 VITALS — BP 118/74 | HR 81 | Temp 98.1°F | Ht 68.5 in | Wt 177.0 lb

## 2018-04-26 DIAGNOSIS — E559 Vitamin D deficiency, unspecified: Secondary | ICD-10-CM

## 2018-04-26 DIAGNOSIS — Z79899 Other long term (current) drug therapy: Secondary | ICD-10-CM | POA: Diagnosis not present

## 2018-04-26 DIAGNOSIS — Z130 Encounter for screening for diseases of the blood and blood-forming organs and certain disorders involving the immune mechanism: Secondary | ICD-10-CM

## 2018-04-26 DIAGNOSIS — Z0000 Encounter for general adult medical examination without abnormal findings: Secondary | ICD-10-CM | POA: Diagnosis not present

## 2018-04-26 DIAGNOSIS — Z1389 Encounter for screening for other disorder: Secondary | ICD-10-CM | POA: Diagnosis not present

## 2018-04-26 DIAGNOSIS — Z6826 Body mass index (BMI) 26.0-26.9, adult: Secondary | ICD-10-CM

## 2018-04-26 DIAGNOSIS — Z1329 Encounter for screening for other suspected endocrine disorder: Secondary | ICD-10-CM

## 2018-04-26 DIAGNOSIS — Z13220 Encounter for screening for lipoid disorders: Secondary | ICD-10-CM

## 2018-04-26 DIAGNOSIS — M25561 Pain in right knee: Secondary | ICD-10-CM

## 2018-04-26 NOTE — Patient Instructions (Addendum)
Patellofemoral Pain Syndrome  Patellofemoral pain syndrome is a condition in which the tissue (cartilage) on the underside of the kneecap (patella) softens or breaks down. This causes pain in the front of the knee. The condition is also called runner's knee or chondromalacia patella. Patellofemoral pain syndrome is most common in young adults who are active in sports. The knee is the largest joint in the body. The patella covers the front of the knee and is attached to muscles above and below the knee. The underside of the patella is covered with a smooth type of cartilage (synovium). The smooth surface helps the patella to glide easily when you move your knee. Patellofemoral pain syndrome causes swelling in the joint linings and bone surfaces in the knee. What are the causes? This condition may be caused by:  Overuse of the knee.  Poor alignment of your knee joints.  Weak leg muscles.  A direct blow to your kneecap. What increases the risk? You are more likely to develop this condition if:  You do a lot of activities that can wear down your kneecap. These include: ? Running. ? Squatting. ? Climbing stairs.  You start a new physical activity or exercise program.  You wear shoes that do not fit well.  You do not have good leg strength.  You are overweight. What are the signs or symptoms? The main symptom of this condition is knee pain. This may feel like a dull, aching pain underneath your patella, in the front of your knee. There may be a popping or cracking sound when you move your knee. Pain may get worse with:  Exercise.  Climbing stairs.  Running.  Jumping.  Squatting.  Kneeling.  Sitting for a long time.  Moving or pushing on your patella. How is this diagnosed? This condition may be diagnosed based on:  Your symptoms and medical history. You may be asked about your recent physical activities and which ones cause knee pain.  A physical exam. This may  include: ? Moving your patella back and forth. ? Checking your range of knee motion. ? Having you squat or jump to see if you have pain. ? Checking the strength of your leg muscles.  Imaging tests to confirm the diagnosis. These may include an MRI of your knee. How is this treated? This condition may be treated at home with rest, ice, compression, and elevation (RICE).  Other treatments may include:  Nonsteroidal anti-inflammatory drugs (NSAIDs).  Physical therapy to stretch and strengthen your leg muscles.  Shoe inserts (orthotics) to take stress off your knee.  A knee brace or knee support.  Adhesive tapes to the skin.  Surgery to remove damaged cartilage or move the patella to a better position. This is rare. Follow these instructions at home: If you have a shoe or brace:  Wear the shoe or brace as told by your health care provider. Remove it only as told by your health care provider.  Loosen the shoe or brace if your toes tingle, become numb, or turn cold and blue.  Keep the shoe or brace clean.  If the shoe or brace is not waterproof: ? Do not let it get wet. ? Cover it with a watertight covering when you take a bath or a shower. Managing pain, stiffness, and swelling  If directed, put ice on the painful area. ? If you have a removable shoe or brace, remove it as told by your health care provider. ? Put ice in a plastic bag. ?  Place a towel between your skin and the bag. ? Leave the ice on for 20 minutes, 2-3 times a day.  Move your toes often to avoid stiffness and to lessen swelling.  Rest your knee: ? Avoid activities that cause knee pain. ? When sitting or lying down, raise (elevate) the injured area above the level of your heart, whenever possible. General instructions  Take over-the-counter and prescription medicines only as told by your health care provider.  Use splints, braces, knee supports, or walking aids as directed by your health care  provider.  Perform stretching and strengthening exercises as told by your health care provider or physical therapist.  Do not use any products that contain nicotine or tobacco, such as cigarettes and e-cigarettes. These can delay healing. If you need help quitting, ask your health care provider.  Return to your normal activities as told by your health care provider. Ask your health care provider what activities are safe for you.  Keep all follow-up visits as told by your health care provider. This is important. Contact a health care provider if:  Your symptoms get worse.  You are not improving with home care. Summary  Patellofemoral pain syndrome is a condition in which the tissue (cartilage) on the underside of the kneecap (patella) softens or breaks down.  This condition causes swelling in the joint linings and bone surfaces in the knee. This leads to pain in the front of the knee.  This condition may be treated at home with rest, ice, compression, and elevation (RICE).  Use splints, braces, knee supports, or walking aids as directed by your health care provider. This information is not intended to replace advice given to you by your health care provider. Make sure you discuss any questions you have with your health care provider. Document Released: 02/04/2009 Document Revised: 03/29/2017 Document Reviewed: 03/29/2017 Elsevier Interactive Patient Education  2019 [employer].  GENERAL HEALTH GOALS  Know what a healthy weight is for you (roughly BMI <25) and aim to maintain this  Aim for 7+ servings of fruits and vegetables daily  70-80+ fluid ounces of water or unsweet tea for healthy kidneys  Limit to max 1 drink of alcohol per day; avoid smoking/tobacco  Limit animal fats in diet for cholesterol and heart health - choose grass fed whenever available  Avoid highly processed foods, and foods high in saturated/trans fats  Aim for low stress - take time to unwind and care  for your mental health  Aim for 150 min of moderate intensity exercise weekly for heart health, and weights twice weekly for bone health  Aim for 7-9 hours of sleep daily

## 2018-04-27 LAB — LIPID PANEL
Cholesterol: 170 mg/dL (ref <200)
HDL: 55 mg/dL (ref >=50)
LDL Cholesterol (Calc): 96 mg/dL (calc)
NON-HDL CHOLESTEROL (CALC): 115 mg/dL (ref <130)
TRIGLYCERIDES: 92 mg/dL (ref <150)
Total CHOL/HDL Ratio: 3.1 (calc) (ref <5.0)

## 2018-04-27 LAB — IRON, TOTAL/TOTAL IRON BINDING CAP
%SAT: 18 % (ref 16–45)
Iron: 61 ug/dL (ref 40–190)
TIBC: 348 mcg/dL (calc) (ref 250–450)

## 2018-04-27 LAB — URINALYSIS, ROUTINE W REFLEX MICROSCOPIC
Bilirubin Urine: NEGATIVE
Glucose, UA: NEGATIVE
HGB URINE DIPSTICK: NEGATIVE
KETONES UR: NEGATIVE
Leukocytes,Ua: NEGATIVE
Nitrite: NEGATIVE
Protein, ur: NEGATIVE
Specific Gravity, Urine: 1.016 (ref 1.001–1.03)
pH: 7 (ref 5.0–8.0)

## 2018-04-27 LAB — CBC WITH DIFFERENTIAL/PLATELET
Absolute Monocytes: 297 cells/uL (ref 200–950)
BASOS ABS: 21 {cells}/uL (ref 0–200)
Basophils Relative: 0.4 %
EOS PCT: 3.9 %
Eosinophils Absolute: 207 cells/uL (ref 15–500)
HCT: 41.1 % (ref 35.0–45.0)
Hemoglobin: 14.4 g/dL (ref 11.7–15.5)
Lymphs Abs: 1749 cells/uL (ref 850–3900)
MCH: 32.6 pg (ref 27.0–33.0)
MCHC: 35 g/dL (ref 32.0–36.0)
MCV: 93 fL (ref 80.0–100.0)
MPV: 13 fL — ABNORMAL HIGH (ref 7.5–12.5)
Monocytes Relative: 5.6 %
NEUTROS ABS: 3026 {cells}/uL (ref 1500–7800)
Neutrophils Relative %: 57.1 %
Platelets: 199 10*3/uL (ref 140–400)
RBC: 4.42 10*6/uL (ref 3.80–5.10)
RDW: 12.5 % (ref 11.0–15.0)
Total Lymphocyte: 33 %
WBC: 5.3 10*3/uL (ref 3.8–10.8)

## 2018-04-27 LAB — COMPLETE METABOLIC PANEL WITH GFR
AG RATIO: 1.4 (calc) (ref 1.0–2.5)
ALKALINE PHOSPHATASE (APISO): 60 U/L (ref 31–125)
ALT: 15 U/L (ref 6–29)
AST: 19 U/L (ref 10–30)
Albumin: 4.6 g/dL (ref 3.6–5.1)
BILIRUBIN TOTAL: 0.6 mg/dL (ref 0.2–1.2)
BUN: 11 mg/dL (ref 7–25)
CO2: 25 mmol/L (ref 20–32)
Calcium: 9.6 mg/dL (ref 8.6–10.2)
Chloride: 106 mmol/L (ref 98–110)
Creat: 0.8 mg/dL (ref 0.50–1.10)
GFR, Est African American: 114 mL/min/{1.73_m2} (ref >=60)
GFR, Est Non African American: 98 mL/min/{1.73_m2} (ref >=60)
GLUCOSE: 92 mg/dL (ref 65–99)
Globulin: 3.3 g/dL (calc) (ref 1.9–3.7)
Potassium: 4 mmol/L (ref 3.5–5.3)
Sodium: 138 mmol/L (ref 135–146)
Total Protein: 7.9 g/dL (ref 6.1–8.1)

## 2018-04-27 LAB — MICROALBUMIN / CREATININE URINE RATIO
Creatinine, Urine: 121 mg/dL (ref 20–275)
Microalb Creat Ratio: 2 mcg/mg creat (ref <30)
Microalb, Ur: 0.3 mg/dL

## 2018-04-27 LAB — VITAMIN B12: Vitamin B-12: 470 pg/mL (ref 200–1100)

## 2018-04-27 LAB — VITAMIN D 25 HYDROXY (VIT D DEFICIENCY, FRACTURES): Vit D, 25-Hydroxy: 34 ng/mL (ref 30–100)

## 2018-04-27 LAB — TSH: TSH: 0.71 mIU/L

## 2018-04-27 LAB — MAGNESIUM: Magnesium: 2 mg/dL (ref 1.5–2.5)

## 2018-11-09 ENCOUNTER — Other Ambulatory Visit: Payer: Self-pay

## 2018-11-09 DIAGNOSIS — Z20822 Contact with and (suspected) exposure to covid-19: Secondary | ICD-10-CM

## 2018-11-09 DIAGNOSIS — Z20828 Contact with and (suspected) exposure to other viral communicable diseases: Secondary | ICD-10-CM | POA: Diagnosis not present

## 2018-11-11 LAB — NOVEL CORONAVIRUS, NAA: SARS-CoV-2, NAA: NOT DETECTED

## 2019-01-12 DIAGNOSIS — G8929 Other chronic pain: Secondary | ICD-10-CM

## 2019-01-13 NOTE — Telephone Encounter (Signed)
Patient was seen in Feb for acute pain of right knee, he has failed outpatient conservative measures and now complains of chronic right knee pain. Will refer to ortho.

## 2019-02-15 DIAGNOSIS — Z6825 Body mass index (BMI) 25.0-25.9, adult: Secondary | ICD-10-CM | POA: Diagnosis not present

## 2019-02-15 DIAGNOSIS — Z01419 Encounter for gynecological examination (general) (routine) without abnormal findings: Secondary | ICD-10-CM | POA: Diagnosis not present

## 2019-02-15 DIAGNOSIS — Z3040 Encounter for surveillance of contraceptives, unspecified: Secondary | ICD-10-CM | POA: Diagnosis not present

## 2019-02-27 ENCOUNTER — Other Ambulatory Visit: Payer: Self-pay

## 2019-03-14 DIAGNOSIS — R87612 Low grade squamous intraepithelial lesion on cytologic smear of cervix (LGSIL): Secondary | ICD-10-CM | POA: Diagnosis not present

## 2019-03-14 DIAGNOSIS — R87619 Unspecified abnormal cytological findings in specimens from cervix uteri: Secondary | ICD-10-CM | POA: Diagnosis not present

## 2019-03-16 DIAGNOSIS — Z30433 Encounter for removal and reinsertion of intrauterine contraceptive device: Secondary | ICD-10-CM | POA: Diagnosis not present

## 2019-04-27 DIAGNOSIS — Z30431 Encounter for routine checking of intrauterine contraceptive device: Secondary | ICD-10-CM | POA: Diagnosis not present

## 2019-05-01 NOTE — Progress Notes (Signed)
Complete Physical  Assessment and Plan:  BMI 26.0-26.9,adult  Overweight  - long discussion about weight loss, diet, and exercise -recommended diet heavy in fruits and veggies and low in animal meats, cheeses, and dairy products  Vitamin D deficiency -     VITAMIN D 25 Hydroxy (Vit-D Deficiency, Fractures)  Routine general medical examination at a health care facility 1 year  Medication management -     CBC with Differential/Platelet -     COMPLETE METABOLIC PANEL WITH GFR -     Magnesium  Screening cholesterol level -     Lipid panel  Screening for thyroid disorder -     TSH  Screening for hematuria or proteinuria -     Urinalysis, Routine w reflex microscopic -     Microalbumin / creatinine urine ratio  Screening, anemia, deficiency, iron -     Iron,Total/Total Iron Binding Cap   Discussed med's effects and SE's. Screening labs and tests as requested with regular follow-up as recommended. Over 40 minutes of exam, counseling, chart review, and complex, high level critical decision making was performed this visit.   HPI  34 y.o. female  presents for a complete physical and follow up.  Husband is Ray.   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   Her blood pressure has been controlled at home, today their BP is BP: 118/72 She does not workout, since the pandemic, declines Pt for knee.  She denies chest pain, shortness of breath, dizziness.  She has allergy history, on allegra year round that helps.   Lab Results  Component Value Date   GFRNONAA 98 04/26/2018   BMI is Body mass index is 26.25 kg/m., she is working on diet and exercise. She has been eating better and not going out due to the pandemic.  Wt Readings from Last 3 Encounters:  05/02/19 175 lb 3.2 oz (79.5 kg)  04/26/18 177 lb (80.3 kg)  04/15/17 182 lb 6.4 oz (82.7 kg)     Current Medications:  No current outpatient medications on file prior to visit.   No current facility-administered medications  on file prior to visit.   Allergies:  No Known Allergies   Medical History:  She does not have a problem list on file.  Health Maintenance:   Tetanus: August 2017 Pneumovax: Prevnar 13:  Flu vaccine: declines Zostavax: Has completed HPV  LMP: IUD Pap: Dr. Candie Mile MGM:  DEXA: Colonoscopy: EGD:  Patient Care Team: Unk Pinto, MD as PCP - General (Internal Medicine)  Surgical History:  She has a past surgical history that includes Cervical biopsy w/ loop electrode excision (2012) and Hernia repair (1996). Family History:  Herfamily history includes Asthma in her sister; Hypertension in her mother; Kidney Stones in her mother and sister; Migraines in her mother. Social History:  She reports that she has never smoked. She has never used smokeless tobacco. She reports current alcohol use. She reports that she does not use drugs.  Review of Systems: Review of Systems  Constitutional: Negative.   HENT: Negative.   Eyes: Negative.   Respiratory: Negative.   Cardiovascular: Negative.   Gastrointestinal: Negative.   Genitourinary: Negative.   Musculoskeletal: Positive for joint pain.  Skin: Negative.     Physical Exam: Estimated body mass index is 26.25 kg/m as calculated from the following:   Height as of this encounter: 5' 8.5" (1.74 m).   Weight as of this encounter: 175 lb 3.2 oz (79.5 kg). BP 118/72   Pulse 62  Temp (!) 97.5 F (36.4 C)   Ht 5' 8.5" (1.74 m)   Wt 175 lb 3.2 oz (79.5 kg)   SpO2 98%   BMI 26.25 kg/m  General Appearance: Well nourished, in no apparent distress.  Eyes: PERRLA, EOMs, conjunctiva no swelling or erythema, normal fundi and vessels.  Sinuses: No Frontal/maxillary tenderness  ENT/Mouth: Ext aud canals clear, normal light reflex with TMs without erythema, bulging. Good dentition. No erythema, swelling, or exudate on post pharynx. Tonsils not swollen or erythematous. Hearing normal.  Neck: Supple, thyroid normal. No bruits   Respiratory: Respiratory effort normal, BS equal bilaterally without rales, rhonchi, wheezing or stridor.  Cardio: RRR without murmurs, rubs or gallops. Brisk peripheral pulses without edema.  Chest: symmetric, with normal excursions and percussion.  Breasts: defer GYN Abdomen: Soft, nontender, no guarding, rebound, hernias, masses, or organomegaly.  Lymphatics: Non tender without lymphadenopathy.  Genitourinary: defer GYN Musculoskeletal: Full ROM all peripheral extremities,5/5 strength, and normal gait. Patient is able to ambulate well.   Gait is not  antalgic. right knee with crepitus and tenderness noted anterior knee, patella laxity noted, no effusion, ACL stable, MCL stable, LCL stable and McMurray's negative Skin: Warm, dry without rashes, lesions, ecchymosis. Neuro: Cranial nerves intact, reflexes equal bilaterally. Normal muscle tone, no cerebellar symptoms. Sensation intact.  Psych: Awake and oriented X 3, normal affect, Insight and Judgment appropriate.   EKG: defer  Quentin Mulling 10:29 AM [facility]

## 2019-05-02 ENCOUNTER — Ambulatory Visit (INDEPENDENT_AMBULATORY_CARE_PROVIDER_SITE_OTHER): Payer: BC Managed Care – PPO | Admitting: Physician Assistant

## 2019-05-02 ENCOUNTER — Encounter: Payer: Self-pay | Admitting: Physician Assistant

## 2019-05-02 ENCOUNTER — Other Ambulatory Visit: Payer: Self-pay

## 2019-05-02 VITALS — BP 118/72 | HR 62 | Temp 97.5°F | Ht 68.5 in | Wt 175.2 lb

## 2019-05-02 DIAGNOSIS — Z0000 Encounter for general adult medical examination without abnormal findings: Secondary | ICD-10-CM | POA: Diagnosis not present

## 2019-05-02 DIAGNOSIS — Z1389 Encounter for screening for other disorder: Secondary | ICD-10-CM

## 2019-05-02 DIAGNOSIS — Z13220 Encounter for screening for lipoid disorders: Secondary | ICD-10-CM

## 2019-05-02 DIAGNOSIS — E559 Vitamin D deficiency, unspecified: Secondary | ICD-10-CM | POA: Diagnosis not present

## 2019-05-02 DIAGNOSIS — Z1329 Encounter for screening for other suspected endocrine disorder: Secondary | ICD-10-CM | POA: Diagnosis not present

## 2019-05-02 DIAGNOSIS — Z79899 Other long term (current) drug therapy: Secondary | ICD-10-CM | POA: Diagnosis not present

## 2019-05-02 DIAGNOSIS — Z130 Encounter for screening for diseases of the blood and blood-forming organs and certain disorders involving the immune mechanism: Secondary | ICD-10-CM

## 2019-05-02 NOTE — Patient Instructions (Addendum)
GENERAL HEALTH GOALS  Know what a healthy weight is for you (roughly BMI <25) and aim to maintain this  Aim for 7+ servings of fruits and vegetables daily  70-80+ fluid ounces of water or unsweet tea for healthy kidneys  Limit to max 1 drink of alcohol per day; avoid smoking/tobacco  Limit animal fats in diet for cholesterol and heart health - choose grass fed whenever available  Avoid highly processed foods, and foods high in saturated/trans fats  Aim for low stress - take time to unwind and care for your mental health  Aim for 150 min of moderate intensity exercise weekly for heart health, and weights twice weekly for bone health  Aim for 7-9 hours of sleep daily  Patellofemoral Pain Syndrome  Patellofemoral pain syndrome is a condition in which the tissue (cartilage) on the underside of the kneecap (patella) softens or breaks down. This causes pain in the front of the knee. The condition is also called runner's knee or chondromalacia patella. Patellofemoral pain syndrome is most common in young adults who are active in sports. The knee is the largest joint in the body. The patella covers the front of the knee and is attached to muscles above and below the knee. The underside of the patella is covered with a smooth type of cartilage (synovium). The smooth surface helps the patella to glide easily when you move your knee. Patellofemoral pain syndrome causes swelling in the joint linings and bone surfaces in the knee. What are the causes? This condition may be caused by:  Overuse of the knee.  Poor alignment of your knee joints.  Weak leg muscles.  A direct blow to your kneecap. What increases the risk? You are more likely to develop this condition if:  You do a lot of activities that can wear down your kneecap. These include: ? Running. ? Squatting. ? Climbing stairs.  You start a new physical activity or exercise program.  You wear shoes that do not fit well.  You do  not have good leg strength.  You are overweight. What are the signs or symptoms? The main symptom of this condition is knee pain. This may feel like a dull, aching pain underneath your patella, in the front of your knee. There may be a popping or cracking sound when you move your knee. Pain may get worse with:  Exercise.  Climbing stairs.  Running.  Jumping.  Squatting.  Kneeling.  Sitting for a long time.  Moving or pushing on your patella. How is this diagnosed? This condition may be diagnosed based on:  Your symptoms and medical history. You may be asked about your recent physical activities and which ones cause knee pain.  A physical exam. This may include: ? Moving your patella back and forth. ? Checking your range of knee motion. ? Having you squat or jump to see if you have pain. ? Checking the strength of your leg muscles.  Imaging tests to confirm the diagnosis. These may include an MRI of your knee. How is this treated? This condition may be treated at home with rest, ice, compression, and elevation (RICE).  Other treatments may include:  Nonsteroidal anti-inflammatory drugs (NSAIDs).  Physical therapy to stretch and strengthen your leg muscles.  Shoe inserts (orthotics) to take stress off your knee.  A knee brace or knee support.  Adhesive tapes to the skin.  Surgery to remove damaged cartilage or move the patella to a better position. This is rare. Follow these  instructions at home: If you have a shoe or brace:  Wear the shoe or brace as told by your health care provider. Remove it only as told by your health care provider.  Loosen the shoe or brace if your toes tingle, become numb, or turn cold and blue.  Keep the shoe or brace clean.  If the shoe or brace is not waterproof: ? Do not let it get wet. ? Cover it with a watertight covering when you take a bath or a shower. Managing pain, stiffness, and swelling  If directed, put ice on the  painful area. ? If you have a removable shoe or brace, remove it as told by your health care provider. ? Put ice in a plastic bag. ? Place a towel between your skin and the bag. ? Leave the ice on for 20 minutes, 2-3 times a day.  Move your toes often to avoid stiffness and to lessen swelling.  Rest your knee: ? Avoid activities that cause knee pain. ? When sitting or lying down, raise (elevate) the injured area above the level of your heart, whenever possible. General instructions  Take over-the-counter and prescription medicines only as told by your health care provider.  Use splints, braces, knee supports, or walking aids as directed by your health care provider.  Perform stretching and strengthening exercises as told by your health care provider or physical therapist.  Do not use any products that contain nicotine or tobacco, such as cigarettes and e-cigarettes. These can delay healing. If you need help quitting, ask your health care provider.  Return to your normal activities as told by your health care provider. Ask your health care provider what activities are safe for you.  Keep all follow-up visits as told by your health care provider. This is important. Contact a health care provider if:  Your symptoms get worse.  You are not improving with home care. Summary  Patellofemoral pain syndrome is a condition in which the tissue (cartilage) on the underside of the kneecap (patella) softens or breaks down.  This condition causes swelling in the joint linings and bone surfaces in the knee. This leads to pain in the front of the knee.  This condition may be treated at home with rest, ice, compression, and elevation (RICE).  Use splints, braces, knee supports, or walking aids as directed by your health care provider. This information is not intended to replace advice given to you by your health care provider. Make sure you discuss any questions you have with your health care  provider. Document Revised: 03/29/2017 Document Reviewed: 03/29/2017 Elsevier Patient Education  2020 [employer].

## 2019-05-03 LAB — COMPLETE METABOLIC PANEL WITH GFR
AG Ratio: 1.4 (calc) (ref 1.0–2.5)
ALT: 17 U/L (ref 6–29)
AST: 19 U/L (ref 10–30)
Albumin: 4.2 g/dL (ref 3.6–5.1)
Alkaline phosphatase (APISO): 57 U/L (ref 31–125)
BUN: 9 mg/dL (ref 7–25)
CO2: 26 mmol/L (ref 20–32)
Calcium: 9.4 mg/dL (ref 8.6–10.2)
Chloride: 108 mmol/L (ref 98–110)
Creat: 0.76 mg/dL (ref 0.50–1.10)
GFR, Est African American: 120 mL/min/{1.73_m2} (ref >=60)
GFR, Est Non African American: 104 mL/min/{1.73_m2} (ref >=60)
Globulin: 3 g/dL (calc) (ref 1.9–3.7)
Glucose, Bld: 89 mg/dL (ref 65–99)
Potassium: 4.5 mmol/L (ref 3.5–5.3)
Sodium: 138 mmol/L (ref 135–146)
Total Bilirubin: 0.6 mg/dL (ref 0.2–1.2)
Total Protein: 7.2 g/dL (ref 6.1–8.1)

## 2019-05-03 LAB — CBC WITH DIFFERENTIAL/PLATELET
Absolute Monocytes: 299 cells/uL (ref 200–950)
Basophils Absolute: 12 cells/uL (ref 0–200)
Basophils Relative: 0.2 %
Eosinophils Absolute: 238 cells/uL (ref 15–500)
Eosinophils Relative: 3.9 %
HCT: 40.3 % (ref 35.0–45.0)
Hemoglobin: 13.9 g/dL (ref 11.7–15.5)
Lymphs Abs: 1696 cells/uL (ref 850–3900)
MCH: 32.3 pg (ref 27.0–33.0)
MCHC: 34.5 g/dL (ref 32.0–36.0)
MCV: 93.5 fL (ref 80.0–100.0)
MPV: 12.2 fL (ref 7.5–12.5)
Monocytes Relative: 4.9 %
Neutro Abs: 3855 cells/uL (ref 1500–7800)
Neutrophils Relative %: 63.2 %
Platelets: 182 10*3/uL (ref 140–400)
RBC: 4.31 10*6/uL (ref 3.80–5.10)
RDW: 11.9 % (ref 11.0–15.0)
Total Lymphocyte: 27.8 %
WBC: 6.1 10*3/uL (ref 3.8–10.8)

## 2019-05-03 LAB — LIPID PANEL
Cholesterol: 184 mg/dL (ref <200)
HDL: 63 mg/dL (ref >=50)
LDL Cholesterol (Calc): 106 mg/dL (calc) — ABNORMAL HIGH
Non-HDL Cholesterol (Calc): 121 mg/dL (calc) (ref <130)
Total CHOL/HDL Ratio: 2.9 (calc) (ref <5.0)
Triglycerides: 67 mg/dL (ref <150)

## 2019-05-03 LAB — VITAMIN D 25 HYDROXY (VIT D DEFICIENCY, FRACTURES): Vit D, 25-Hydroxy: 23 ng/mL — ABNORMAL LOW (ref 30–100)

## 2019-05-03 LAB — MAGNESIUM: Magnesium: 2 mg/dL (ref 1.5–2.5)

## 2019-05-03 LAB — IRON, TOTAL/TOTAL IRON BINDING CAP: Iron: 84 ug/dL (ref 40–190)

## 2019-05-03 LAB — TSH: TSH: 0.97 mIU/L

## 2019-05-03 LAB — FERRITIN: Ferritin: 83 ng/mL (ref 16–154)

## 2019-05-03 LAB — IRON,?TOTAL/TOTAL IRON BINDING CAP
%SAT: 26 % (calc) (ref 16–45)
TIBC: 320 mcg/dL (calc) (ref 250–450)

## 2019-05-03 MED ORDER — VITAMIN D (ERGOCALCIFEROL) 1.25 MG (50000 UNIT) PO CAPS: 50000.0000 [IU] | ORAL_CAPSULE | ORAL | 0 refills | Status: DC

## 2019-06-16 DIAGNOSIS — Z03818 Encounter for observation for suspected exposure to other biological agents ruled out: Secondary | ICD-10-CM | POA: Diagnosis not present

## 2019-06-23 ENCOUNTER — Other Ambulatory Visit: Payer: Self-pay | Admitting: Physician Assistant

## 2019-07-27 DIAGNOSIS — Z20828 Contact with and (suspected) exposure to other viral communicable diseases: Secondary | ICD-10-CM | POA: Diagnosis not present

## 2019-07-27 DIAGNOSIS — Z03818 Encounter for observation for suspected exposure to other biological agents ruled out: Secondary | ICD-10-CM | POA: Diagnosis not present

## 2019-09-14 DIAGNOSIS — R87610 Atypical squamous cells of undetermined significance on cytologic smear of cervix (ASC-US): Secondary | ICD-10-CM | POA: Diagnosis not present

## 2019-09-14 DIAGNOSIS — Z20822 Contact with and (suspected) exposure to covid-19: Secondary | ICD-10-CM | POA: Diagnosis not present

## 2019-09-14 DIAGNOSIS — Z03818 Encounter for observation for suspected exposure to other biological agents ruled out: Secondary | ICD-10-CM | POA: Diagnosis not present

## 2019-09-14 DIAGNOSIS — R87612 Low grade squamous intraepithelial lesion on cytologic smear of cervix (LGSIL): Secondary | ICD-10-CM | POA: Diagnosis not present

## 2019-11-07 DIAGNOSIS — Z03818 Encounter for observation for suspected exposure to other biological agents ruled out: Secondary | ICD-10-CM | POA: Diagnosis not present

## 2019-11-07 DIAGNOSIS — Z20822 Contact with and (suspected) exposure to covid-19: Secondary | ICD-10-CM | POA: Diagnosis not present

## 2019-12-14 DIAGNOSIS — Z20822 Contact with and (suspected) exposure to covid-19: Secondary | ICD-10-CM | POA: Diagnosis not present

## 2019-12-14 DIAGNOSIS — Z03818 Encounter for observation for suspected exposure to other biological agents ruled out: Secondary | ICD-10-CM | POA: Diagnosis not present

## 2020-01-03 DIAGNOSIS — Z03818 Encounter for observation for suspected exposure to other biological agents ruled out: Secondary | ICD-10-CM | POA: Diagnosis not present

## 2020-01-03 DIAGNOSIS — Z20822 Contact with and (suspected) exposure to covid-19: Secondary | ICD-10-CM | POA: Diagnosis not present

## 2020-01-31 DIAGNOSIS — Z20822 Contact with and (suspected) exposure to covid-19: Secondary | ICD-10-CM | POA: Diagnosis not present

## 2020-01-31 DIAGNOSIS — Z03818 Encounter for observation for suspected exposure to other biological agents ruled out: Secondary | ICD-10-CM | POA: Diagnosis not present

## 2020-02-16 DIAGNOSIS — R87610 Atypical squamous cells of undetermined significance on cytologic smear of cervix (ASC-US): Secondary | ICD-10-CM | POA: Diagnosis not present

## 2020-02-16 DIAGNOSIS — Z6825 Body mass index (BMI) 25.0-25.9, adult: Secondary | ICD-10-CM | POA: Diagnosis not present

## 2020-02-16 DIAGNOSIS — Z03818 Encounter for observation for suspected exposure to other biological agents ruled out: Secondary | ICD-10-CM | POA: Diagnosis not present

## 2020-02-16 DIAGNOSIS — Z01419 Encounter for gynecological examination (general) (routine) without abnormal findings: Secondary | ICD-10-CM | POA: Diagnosis not present

## 2020-02-16 DIAGNOSIS — Z20822 Contact with and (suspected) exposure to covid-19: Secondary | ICD-10-CM | POA: Diagnosis not present

## 2020-05-01 ENCOUNTER — Encounter: Payer: Self-pay | Admitting: Adult Health Nurse Practitioner

## 2020-05-01 ENCOUNTER — Other Ambulatory Visit: Payer: Self-pay

## 2020-05-01 ENCOUNTER — Ambulatory Visit (INDEPENDENT_AMBULATORY_CARE_PROVIDER_SITE_OTHER): Payer: BC Managed Care – PPO | Admitting: Adult Health Nurse Practitioner

## 2020-05-01 VITALS — BP 110/70 | HR 67 | Temp 97.5°F | Ht 68.0 in | Wt 172.0 lb

## 2020-05-01 DIAGNOSIS — Z0000 Encounter for general adult medical examination without abnormal findings: Secondary | ICD-10-CM | POA: Diagnosis not present

## 2020-05-01 DIAGNOSIS — Z130 Encounter for screening for diseases of the blood and blood-forming organs and certain disorders involving the immune mechanism: Secondary | ICD-10-CM | POA: Diagnosis not present

## 2020-05-01 DIAGNOSIS — Z1329 Encounter for screening for other suspected endocrine disorder: Secondary | ICD-10-CM

## 2020-05-01 DIAGNOSIS — Z1389 Encounter for screening for other disorder: Secondary | ICD-10-CM

## 2020-05-01 DIAGNOSIS — Z6826 Body mass index (BMI) 26.0-26.9, adult: Secondary | ICD-10-CM

## 2020-05-01 DIAGNOSIS — R7309 Other abnormal glucose: Secondary | ICD-10-CM

## 2020-05-01 DIAGNOSIS — I10 Essential (primary) hypertension: Secondary | ICD-10-CM | POA: Diagnosis not present

## 2020-05-01 DIAGNOSIS — Z136 Encounter for screening for cardiovascular disorders: Secondary | ICD-10-CM

## 2020-05-01 DIAGNOSIS — Z79899 Other long term (current) drug therapy: Secondary | ICD-10-CM

## 2020-05-01 DIAGNOSIS — M25561 Pain in right knee: Secondary | ICD-10-CM

## 2020-05-01 DIAGNOSIS — Z13220 Encounter for screening for lipoid disorders: Secondary | ICD-10-CM | POA: Diagnosis not present

## 2020-05-01 DIAGNOSIS — Z131 Encounter for screening for diabetes mellitus: Secondary | ICD-10-CM | POA: Diagnosis not present

## 2020-05-01 DIAGNOSIS — Z0001 Encounter for general adult medical examination with abnormal findings: Secondary | ICD-10-CM

## 2020-05-01 DIAGNOSIS — E559 Vitamin D deficiency, unspecified: Secondary | ICD-10-CM | POA: Diagnosis not present

## 2020-05-01 DIAGNOSIS — Z1321 Encounter for screening for nutritional disorder: Secondary | ICD-10-CM

## 2020-05-01 DIAGNOSIS — G8929 Other chronic pain: Secondary | ICD-10-CM

## 2020-05-01 LAB — CBC WITH DIFFERENTIAL/PLATELET
Eosinophils Relative: 4.4 %
Lymphs Abs: 1798 cells/uL (ref 850–3900)

## 2020-05-01 NOTE — Progress Notes (Signed)
COMPLETE PHYSICALS  Assessment and Plan:  Diagnoses and all orders for this visit:  Encounter for routine adult medical exam with abnormal findings Yearly  Vitamin D deficiency Taking Vitamin D supplementation at this time  Vitamin D level  Chronic pain of right knee Doing well at this time  Medication management Continued  Screening for thyroid disorder -TSH  Screening, lipid -Lipids  Screening ischemia NSR w/ PAC, no comparison Asymptomatic  Screening for blood or protein in urine -UA  BMI 26.0-26.9 Discussed dietary and exercise modifications    Discussed med's effects and SE's. Screening labs and tests as requested with regular follow-up as recommended. Over 40 minutes of face to face interview, exam, counseling, chart review, and complex, high level critical decision making was performed this visit.   HPI  35 y.o. female  presents for a complete physical and follow up.   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  She I married, husband Ray, no children.  No plans for children at this time.  Her blood pressure has been controlled at home, today their BP is BP: 110/70 She does not workout, since the pandemic, declines Pt for knee.  She denies chest pain, shortness of breath, dizziness.  She has allergy history, on allegra year round that helps.   Lab Results  Component Value Date   GFRNONAA 104 05/02/2019   BMI is Body mass index is 26.15 kg/m., she is working on diet and exercise. She has been eating better and not going out due to the pandemic.  Wt Readings from Last 3 Encounters:  05/01/20 172 lb (78 kg)  05/02/19 175 lb 3.2 oz (79.5 kg)  04/26/18 177 lb (80.3 kg)     Current Medications:  Current Outpatient Medications on File Prior to Visit  Medication Sig Dispense Refill  . Vitamin D, Ergocalciferol, (DRISDOL) 1.25 MG (50000 UNIT) CAPS capsule  TAKE 1 CAPSULE (50,000 UNITS TOTAL) BY MOUTH EVERY 7 (SEVEN) DAYS. 8 capsule 3   No current facility-administered medications on file prior to visit.   Allergies:  No Known Allergies   Medical History:  She does not have a problem list on file.  Health Maintenance:   Tetanus: August 2017 Pneumovax: N/A Prevnar 13: N/A Flu vaccine: declines Zostavax: N/A Has completed HPV  LMP: IUD Pap: Dr. Perlie Gold MGM: N/A DEXA:N/A Colonoscopy: N/A EGD:N/A  Eye exam: lasix surgery Dentist: 03/2020 Patient Care Team: Lucky Cowboy, MD as PCP - General (Internal Medicine)  Surgical History:  She has a past surgical history that includes Cervical biopsy w/ loop electrode excision (2012) and Hernia repair (1996). Family History:  Herfamily history includes Asthma in her sister; Hypertension in her mother; Kidney Stones in her mother and sister; Migraines in her mother. Social History:  She reports that she has never smoked. She has never used smokeless tobacco. She reports current alcohol use. She reports that she does not use drugs.  Review of Systems: Review of Systems  Constitutional: Negative.   HENT: Negative.   Eyes: Negative.   Respiratory: Negative.   Cardiovascular: Negative.   Gastrointestinal: Negative.   Genitourinary: Negative.   Musculoskeletal: Positive for joint pain.  Skin: Negative.     Physical Exam: Estimated body mass index is 26.15 kg/m as calculated from the following:   Height as of this encounter: 5\' 8"  (1.727 m).   Weight as of this encounter: 172  lb (78 kg). BP 110/70   Pulse 67   Temp (!) 97.5 F (36.4 C)   Ht 5\' 8"  (1.727 m)   Wt 172 lb (78 kg)   LMP 04/24/2020   SpO2 98%   BMI 26.15 kg/m  General Appearance: Well nourished, in no apparent distress.  Eyes: PERRLA, EOMs, conjunctiva no swelling or erythema, normal fundi and vessels.  Sinuses: No Frontal/maxillary tenderness  ENT/Mouth: Ext aud canals clear, normal light reflex with TMs  without erythema, bulging. Good dentition. No erythema, swelling, or exudate on post pharynx. Tonsils not swollen or erythematous. Hearing normal.  Neck: Supple, thyroid normal. No bruits  Respiratory: Respiratory effort normal, BS equal bilaterally without rales, rhonchi, wheezing or stridor.  Cardio: RRR without murmurs, rubs or gallops. Brisk peripheral pulses without edema.  Chest: symmetric, with normal excursions and percussion.  Breasts: defer GYN Abdomen: Soft, nontender, no guarding, rebound, hernias, masses, or organomegaly.  Lymphatics: Non tender without lymphadenopathy.  Genitourinary: defer GYN Musculoskeletal: Full ROM all peripheral extremities,5/5 strength, and normal gait. Patient is able to ambulate well.   Gait is not  antalgic. right knee with crepitus and tenderness noted anterior knee, patella laxity noted, no effusion, ACL stable, MCL stable, LCL stable and McMurray's negative Skin: Warm, dry without rashes, lesions, ecchymosis. Neuro: Cranial nerves intact, reflexes equal bilaterally. Normal muscle tone, no cerebellar symptoms. Sensation intact.  Psych: Awake and oriented X 3, normal affect, Insight and Judgment appropriate.   EKG: defer  Kyre Jeffries 10:40 AM [facility]

## 2020-05-02 ENCOUNTER — Other Ambulatory Visit: Payer: Self-pay | Admitting: Adult Health Nurse Practitioner

## 2020-05-02 DIAGNOSIS — E559 Vitamin D deficiency, unspecified: Secondary | ICD-10-CM

## 2020-05-02 LAB — COMPLETE METABOLIC PANEL WITH GFR
AG Ratio: 1.3 (calc) (ref 1.0–2.5)
ALT: 11 U/L (ref 6–29)
AST: 19 U/L (ref 10–30)
Albumin: 4.3 g/dL (ref 3.6–5.1)
Alkaline phosphatase (APISO): 58 U/L (ref 31–125)
BUN: 15 mg/dL (ref 7–25)
CO2: 27 mmol/L (ref 20–32)
Calcium: 9.3 mg/dL (ref 8.6–10.2)
Chloride: 105 mmol/L (ref 98–110)
Creat: 0.86 mg/dL (ref 0.50–1.10)
GFR, Est African American: 103 mL/min/{1.73_m2} (ref >=60)
GFR, Est Non African American: 89 mL/min/{1.73_m2} (ref >=60)
Globulin: 3.3 g/dL (calc) (ref 1.9–3.7)
Glucose, Bld: 81 mg/dL (ref 65–99)
Potassium: 4.6 mmol/L (ref 3.5–5.3)
Sodium: 139 mmol/L (ref 135–146)
Total Bilirubin: 0.8 mg/dL (ref 0.2–1.2)
Total Protein: 7.6 g/dL (ref 6.1–8.1)

## 2020-05-02 LAB — CBC WITH DIFFERENTIAL/PLATELET
Absolute Monocytes: 308 cells/uL (ref 200–950)
Basophils Absolute: 22 cells/uL (ref 0–200)
Basophils Relative: 0.4 %
Eosinophils Absolute: 238 {cells}/uL (ref 15–500)
HCT: 41.5 % (ref 35.0–45.0)
Hemoglobin: 14.7 g/dL (ref 11.7–15.5)
MCH: 33.7 pg — ABNORMAL HIGH (ref 27.0–33.0)
MCHC: 35.4 g/dL (ref 32.0–36.0)
MCV: 95.2 fL (ref 80.0–100.0)
MPV: 12.4 fL (ref 7.5–12.5)
Monocytes Relative: 5.7 %
Neutro Abs: 3035 cells/uL (ref 1500–7800)
Neutrophils Relative %: 56.2 %
Platelets: 193 Thousand/uL (ref 140–400)
RBC: 4.36 10*6/uL (ref 3.80–5.10)
RDW: 11.8 % (ref 11.0–15.0)
Total Lymphocyte: 33.3 %
WBC: 5.4 Thousand/uL (ref 3.8–10.8)

## 2020-05-02 LAB — URINALYSIS W MICROSCOPIC + REFLEX CULTURE
Bacteria, UA: NONE SEEN /HPF
Bilirubin Urine: NEGATIVE
Glucose, UA: NEGATIVE
Hgb urine dipstick: NEGATIVE
Hyaline Cast: NONE SEEN /LPF
Leukocyte Esterase: NEGATIVE
Nitrites, Initial: NEGATIVE
Protein, ur: NEGATIVE
RBC / HPF: NONE SEEN /HPF (ref 0–2)
Specific Gravity, Urine: 1.02 (ref 1.001–1.03)
WBC, UA: NONE SEEN /HPF (ref 0–5)
pH: 6 (ref 5.0–8.0)

## 2020-05-02 LAB — HEMOGLOBIN A1C
Hgb A1c MFr Bld: 4.4 % of total Hgb (ref <5.7)
Mean Plasma Glucose: 80 mg/dL
eAG (mmol/L): 4.4 mmol/L

## 2020-05-02 LAB — IRON, TOTAL/TOTAL IRON BINDING CAP
%SAT: 21 % (calc) (ref 16–45)
Iron: 66 ug/dL (ref 40–190)
TIBC: 308 mcg/dL (calc) (ref 250–450)

## 2020-05-02 LAB — LIPID PANEL
Cholesterol: 180 mg/dL (ref <200)
HDL: 53 mg/dL (ref >=50)
LDL Cholesterol (Calc): 110 mg/dL (calc) — ABNORMAL HIGH
Non-HDL Cholesterol (Calc): 127 mg/dL (calc) (ref <130)
Total CHOL/HDL Ratio: 3.4 (calc) (ref <5.0)
Triglycerides: 83 mg/dL (ref <150)

## 2020-05-02 LAB — VITAMIN D 25 HYDROXY (VIT D DEFICIENCY, FRACTURES): Vit D, 25-Hydroxy: 25 ng/mL — ABNORMAL LOW (ref 30–100)

## 2020-05-02 LAB — VITAMIN B12: Vitamin B-12: 256 pg/mL (ref 200–1100)

## 2020-05-02 LAB — TSH: TSH: 0.77 mIU/L

## 2020-05-02 LAB — NO CULTURE INDICATED

## 2020-05-02 MED ORDER — CHOLECALCIFEROL 1.25 MG (50000 UT) PO CAPS: ORAL_CAPSULE | ORAL | 0 refills | Status: DC

## 2020-06-14 DIAGNOSIS — Z20822 Contact with and (suspected) exposure to covid-19: Secondary | ICD-10-CM | POA: Diagnosis not present

## 2020-06-14 DIAGNOSIS — Z03818 Encounter for observation for suspected exposure to other biological agents ruled out: Secondary | ICD-10-CM | POA: Diagnosis not present

## 2020-07-11 DIAGNOSIS — Z20822 Contact with and (suspected) exposure to covid-19: Secondary | ICD-10-CM | POA: Diagnosis not present

## 2020-07-11 DIAGNOSIS — Z03818 Encounter for observation for suspected exposure to other biological agents ruled out: Secondary | ICD-10-CM | POA: Diagnosis not present

## 2020-08-13 ENCOUNTER — Emergency Department (HOSPITAL_BASED_OUTPATIENT_CLINIC_OR_DEPARTMENT_OTHER)
Admission: EM | Admit: 2020-08-13 | Discharge: 2020-08-13 | Disposition: A | Discharge status: Home / Self Care | Payer: BC Managed Care – PPO | Attending: Emergency Medicine | Admitting: Emergency Medicine

## 2020-08-13 ENCOUNTER — Other Ambulatory Visit: Payer: Self-pay

## 2020-08-13 ENCOUNTER — Ambulatory Visit (HOSPITAL_COMMUNITY)
Admission: EM | Admit: 2020-08-13 | Discharge: 2020-08-13 | Disposition: A | Discharge status: Home / Self Care | Payer: BC Managed Care – PPO

## 2020-08-13 ENCOUNTER — Emergency Department (HOSPITAL_BASED_OUTPATIENT_CLINIC_OR_DEPARTMENT_OTHER): Payer: BC Managed Care – PPO

## 2020-08-13 ENCOUNTER — Encounter (HOSPITAL_BASED_OUTPATIENT_CLINIC_OR_DEPARTMENT_OTHER): Payer: Self-pay

## 2020-08-13 ENCOUNTER — Encounter (HOSPITAL_COMMUNITY): Payer: Self-pay | Admitting: Emergency Medicine

## 2020-08-13 DIAGNOSIS — R0602 Shortness of breath: Secondary | ICD-10-CM | POA: Diagnosis not present

## 2020-08-13 DIAGNOSIS — K2900 Acute gastritis without bleeding: Secondary | ICD-10-CM | POA: Insufficient documentation

## 2020-08-13 DIAGNOSIS — R509 Fever, unspecified: Secondary | ICD-10-CM | POA: Diagnosis not present

## 2020-08-13 DIAGNOSIS — Z20822 Contact with and (suspected) exposure to covid-19: Secondary | ICD-10-CM | POA: Insufficient documentation

## 2020-08-13 DIAGNOSIS — R1013 Epigastric pain: Secondary | ICD-10-CM | POA: Diagnosis not present

## 2020-08-13 DIAGNOSIS — R1110 Vomiting, unspecified: Secondary | ICD-10-CM | POA: Diagnosis not present

## 2020-08-13 LAB — CBC
HCT: 39.9 % (ref 36.0–46.0)
Hemoglobin: 13.7 g/dL (ref 12.0–15.0)
MCH: 32.2 pg (ref 26.0–34.0)
MCHC: 34.3 g/dL (ref 30.0–36.0)
MCV: 93.9 fL (ref 80.0–100.0)
Platelets: 163 10*3/uL (ref 150–400)
RBC: 4.25 MIL/uL (ref 3.87–5.11)
RDW: 12.7 % (ref 11.5–15.5)
WBC: 10 10*3/uL (ref 4.0–10.5)
nRBC: 0 % (ref 0.0–0.2)

## 2020-08-13 LAB — URINALYSIS, ROUTINE W REFLEX MICROSCOPIC
Bilirubin Urine: NEGATIVE
Glucose, UA: NEGATIVE mg/dL
Hgb urine dipstick: NEGATIVE
Ketones, ur: NEGATIVE mg/dL
Nitrite: NEGATIVE
Specific Gravity, Urine: 1.029 (ref 1.005–1.030)
pH: 8 (ref 5.0–8.0)

## 2020-08-13 LAB — COMPREHENSIVE METABOLIC PANEL
ALT: 22 U/L (ref 0–44)
AST: 27 U/L (ref 15–41)
Albumin: 4.3 g/dL (ref 3.5–5.0)
Alkaline Phosphatase: 52 U/L (ref 38–126)
Anion gap: 11 (ref 5–15)
BUN: 13 mg/dL (ref 6–20)
CO2: 21 mmol/L — ABNORMAL LOW (ref 22–32)
Calcium: 9.4 mg/dL (ref 8.9–10.3)
Chloride: 107 mmol/L (ref 98–111)
Creatinine, Ser: 0.93 mg/dL (ref 0.44–1.00)
GFR, Estimated: >60 mL/min (ref >60)
Glucose, Bld: 126 mg/dL — ABNORMAL HIGH (ref 70–99)
Potassium: 4.1 mmol/L (ref 3.5–5.1)
Sodium: 139 mmol/L (ref 135–145)
Total Bilirubin: 0.9 mg/dL (ref 0.3–1.2)
Total Protein: 7.9 g/dL (ref 6.5–8.1)

## 2020-08-13 LAB — LIPASE, BLOOD: Lipase: 34 U/L (ref 11–51)

## 2020-08-13 LAB — RESP PANEL BY RT-PCR (FLU A&B, COVID) ARPGX2
Influenza A by PCR: NEGATIVE
Influenza B by PCR: NEGATIVE
SARS Coronavirus 2 by RT PCR: NEGATIVE

## 2020-08-13 LAB — PREGNANCY, URINE: Preg Test, Ur: NEGATIVE

## 2020-08-13 LAB — TROPONIN I (HIGH SENSITIVITY)
Troponin I (High Sensitivity): <2 ng/L (ref <18)
Troponin I (High Sensitivity): <2 ng/L (ref <18)

## 2020-08-13 MED ORDER — IOHEXOL 300 MG/ML  SOLN
100.0000 mL | Freq: Once | INTRAMUSCULAR | Status: AC | PRN
  Administered 2020-08-13: 100 mL via INTRAVENOUS

## 2020-08-13 MED ORDER — ONDANSETRON HCL 4 MG PO TABS: 4.0000 mg | ORAL_TABLET | Freq: Four times a day (QID) | ORAL | 0 refills | Status: DC

## 2020-08-13 MED ORDER — SODIUM CHLORIDE 0.9 % IV BOLUS
1000.0000 mL | Freq: Once | INTRAVENOUS | Status: AC
  Administered 2020-08-13: 1000 mL via INTRAVENOUS

## 2020-08-13 MED ORDER — PANTOPRAZOLE SODIUM 20 MG PO TBEC: 20.0000 mg | DELAYED_RELEASE_TABLET | Freq: Every day | ORAL | 0 refills | Status: DC

## 2020-08-13 MED ORDER — MORPHINE SULFATE (PF) 4 MG/ML IV SOLN
4.0000 mg | Freq: Once | INTRAVENOUS | Status: AC
  Administered 2020-08-13: 4 mg via INTRAVENOUS
  Filled 2020-08-13: qty 1

## 2020-08-13 MED ORDER — ALUM & MAG HYDROXIDE-SIMETH 200-200-20 MG/5ML PO SUSP
30.0000 mL | Freq: Once | ORAL | Status: DC
  Filled 2020-08-13: qty 30

## 2020-08-13 MED ORDER — LIDOCAINE VISCOUS HCL 2 % MT SOLN
15.0000 mL | Freq: Once | OROMUCOSAL | Status: DC
  Filled 2020-08-13: qty 15

## 2020-08-13 MED ORDER — ONDANSETRON HCL 4 MG/2ML IJ SOLN
4.0000 mg | Freq: Once | INTRAMUSCULAR | Status: AC
  Administered 2020-08-13: 4 mg via INTRAVENOUS
  Filled 2020-08-13: qty 2

## 2020-08-13 NOTE — ED Notes (Signed)
After reviewing with patient and Dr. Chaney Malling, patient decided best plan was to go to ER for full work up.    Patient is being discharged from the Urgent Care and sent to the Emergency Department via private vehicle. Per Dr. Chaney Malling, patient is in need of higher level of care due to work up with imaging expected. Patient is aware and verbalizes understanding of plan of care.  Vitals:   08/13/20 1647  BP: 118/70  Pulse: 98  Resp: 18  Temp: 99.4 F (37.4 C)  SpO2: 99%

## 2020-08-13 NOTE — ED Triage Notes (Signed)
Patient presents to [facility] for evaluation of epigastric pain starting this morning around 1030am.  States the pain continued to worsen, and she had an episode of emesis.  Developed a fever, got up to 103.  Patient states she laid down for a nap and pain has worsened when she woke up.  Patient states she has had three soft bowel movements today, denies diarrhea, denies nausea.

## 2020-08-13 NOTE — ED Triage Notes (Addendum)
Pt c/o epigastric pain that radiates to the back. Pt describes the pain as sharp. Pt has associated N/V, loose stool, fever with TMax of 102.8. Pt states that eating makes the epigastric pain worse.

## 2020-08-13 NOTE — ED Provider Notes (Signed)
[facility] EMERGENCY DEPT Provider Note   CSN: [id number] Arrival date & time: 08/13/20  1709     History Chief Complaint  Patient presents with   Abdominal Pain    [name] [name] is a 35 y.o. female.  HPI     35 year old female presents with concern for epigastric pain with radiation to the back and fever.  Reports that her symptoms began this morning.  Describes it as a sharp pain in her upper abdomen that comes and goes, and radiates to the back.  She reports that the episodes that she had were after eating.  It is severe pain.  She had temperature today of nearly 103.  She has had nausea, vomiting, loose stool.  Reports the pain is located in her lower chest/upper abdomen.  She denies any associated shortness of breath or cough.  No known sick contacts.  Denies urinary symptoms, vaginal discharge or bleeding.  Denies tick bites, IV drug use, rash.  History reviewed. No pertinent past medical history.  There are no problems to display for this patient.   Past Surgical History:  Procedure Laterality Date   CERVICAL BIOPSY  W/ LOOP ELECTRODE EXCISION  2012   HERNIA REPAIR  1996     OB History   No obstetric history on file.     Family History  Problem Relation Age of Onset   Hypertension Mother    Migraines Mother    Kidney Stones Mother    Kidney Stones Sister    Asthma Sister     Social History   Tobacco Use   Smoking status: Never   Smokeless tobacco: Never  Vaping Use   Vaping Use: Never used  Substance Use Topics   Alcohol use: Yes    Comment: rare   Drug use: No    Home Medications Prior to Admission medications   Medication Sig Start Date End Date Taking? Authorizing Provider  ondansetron (ZOFRAN) 4 MG tablet Take 1 tablet (4 mg total) by mouth every 6 (six) hours. 08/13/20  Yes Alvira Monday, MD  pantoprazole (PROTONIX) 20 MG tablet Take 1 tablet (20 mg total) by mouth daily for 14 days. 08/13/20 08/27/20 Yes Alvira Monday,  MD  Cholecalciferol 1.25 MG (50000 UT) capsule Take one tablet by mouth three days a week for twelve weeks. 05/02/20   Elder Negus, NP  levonorgestrel (KYLEENA) 19.5 MG IUD Kyleena 17.5 mcg/24 hrs (64yrs) 19.5mg  intrauterine device  Take 1 device by intrauterine route.    [provider]  Multiple Vitamins-Minerals (MULTIVITAMIN GUMMIES ADULT PO) Take 2 tablets by mouth daily.    [provider]    Allergies    Patient has no known allergies.  Review of Systems   Review of Systems  Constitutional:  Positive for appetite change and fever.  HENT:  Negative for sore throat.   Eyes:  Negative for visual disturbance.  Respiratory:  Negative for cough and shortness of breath.   Cardiovascular:  Positive for chest pain.  Gastrointestinal:  Positive for abdominal pain, nausea and vomiting.  Genitourinary:  Negative for difficulty urinating.  Musculoskeletal:  Negative for back pain and neck pain.  Skin:  Negative for rash.  Neurological:  Negative for syncope and headaches.   Physical Exam Updated Vital Signs BP 107/78   Pulse 80   Temp 99.4 F (37.4 C)   Resp (!) 23   LMP 07/24/2020   SpO2 100%   Physical Exam Vitals and nursing note reviewed.  Constitutional:  General: She is not in acute distress.    Appearance: She is well-developed. She is not diaphoretic.  HENT:     Head: Normocephalic and atraumatic.  Eyes:     Conjunctiva/sclera: Conjunctivae normal.  Cardiovascular:     Rate and Rhythm: Normal rate and regular rhythm.     Heart sounds: Normal heart sounds. No murmur heard.   No friction rub. No gallop.  Pulmonary:     Effort: Pulmonary effort is normal. No respiratory distress.     Breath sounds: Normal breath sounds. No wheezing or rales.  Abdominal:     General: There is no distension.     Palpations: Abdomen is soft.     Tenderness: There is no abdominal tenderness. There is no guarding.  Musculoskeletal:        General: No tenderness.      Cervical back: Normal range of motion.  Skin:    General: Skin is warm and dry.     Findings: No erythema or rash.  Neurological:     Mental Status: She is alert and oriented to person, place, and time.    ED Results / Procedures / Treatments   Labs (all labs ordered are listed, but only abnormal results are displayed) Labs Reviewed  COMPREHENSIVE METABOLIC PANEL - Abnormal; Notable for the following components:      Result Value   CO2 21 (*)    Glucose, Bld 126 (*)    All other components within normal limits  URINALYSIS, ROUTINE W REFLEX MICROSCOPIC - Abnormal; Notable for the following components:   Protein, ur TRACE (*)    Leukocytes,Ua SMALL (*)    All other components within normal limits  RESP PANEL BY RT-PCR (FLU A&B, COVID) ARPGX2  LIPASE, BLOOD  CBC  PREGNANCY, URINE  TROPONIN I (HIGH SENSITIVITY)  TROPONIN I (HIGH SENSITIVITY)    EKG None  Radiology CT ABDOMEN PELVIS W CONTRAST  Result Date: 08/13/2020 CLINICAL DATA:  Gastric pain with nausea and vomiting EXAM: CT ABDOMEN AND PELVIS WITH CONTRAST TECHNIQUE: Multidetector CT imaging of the abdomen and pelvis was performed using the standard protocol following bolus administration of intravenous contrast. CONTRAST:  OMNIPAQUE IOHEXOL 300 MG/ML  SOLN COMPARISON:  None. FINDINGS: Lower chest: Lung bases are clear. Hepatobiliary: No focal liver lesions are appreciable. Gallbladder wall is not appreciably thickened. There is no biliary duct dilatation. Pancreas: There is no pancreatic mass or inflammatory focus. Spleen: No splenic lesions are evident. Adrenals/Urinary Tract: Adrenals bilaterally appear normal. There is a cyst along the lateral lower pole left kidney measuring 5 x 5 mm. There is a 3 mm cyst in the mid left kidney laterally. There is no evident hydronephrosis on either side. There is no appreciable renal or ureteral calculus on either side. Urinary bladder is midline with wall thickness within normal  limits. Stomach/Bowel: There is no appreciable bowel wall or mesenteric thickening. No evident bowel obstruction. Terminal ileum appears normal. Appendix appears normal. Note that the appendix is in the midline of the upper pelvis. No free air or portal venous air. Vascular/Lymphatic: No abdominal aortic aneurysm. No arterial vascular lesions are evident. Major venous structures appear patent. There is no evident adenopathy in the abdomen or pelvis. Reproductive: The uterus is anteverted. There is an intrauterine device positioned within the endometrium. There is a collapsed cyst within the left ovary measuring 1.5 x 1.0 cm. No other adnexal mass. No free free pelvic fluid. Other: No evident abscess or ascites in the abdomen or pelvis.  Musculoskeletal: No blastic or lytic bone lesions. No abdominal wall or intramuscular lesions are evident. IMPRESSION: 1. Small collapsed cyst in left ovary without surrounding fluid. No other adnexal mass. 2.  Intrauterine device within endometrium. 3. No bowel wall thickening or bowel obstruction. No abscess in the abdomen or pelvis. Appendix appears normal. 4. No renal or ureteral calculus. No hydronephrosis. Urinary bladder wall thickness normal. Electronically Signed   By: Bretta Bang III M.D.   On: 08/13/2020 19:19   DG Chest Portable 1 View  Result Date: 08/13/2020 CLINICAL DATA:  Shortness of breath EXAM: PORTABLE CHEST 1 VIEW COMPARISON:  Chest CT February 16, 2006 FINDINGS: Lungs are clear. Heart size and pulmonary vascularity are normal. No adenopathy. Pneumothorax. No bone lesions. IMPRESSION: Lungs clear.  Cardiac silhouette normal. Electronically Signed   By: Bretta Bang III M.D.   On: 08/13/2020 19:14    Procedures Procedures   Medications Ordered in ED Medications  alum & mag hydroxide-simeth (MAALOX/MYLANTA) 200-200-20 MG/5ML suspension 30 mL (30 mLs Oral Patient Refused/Not Given 08/13/20 2057)    And  lidocaine (XYLOCAINE) 2 % viscous mouth  solution 15 mL (15 mLs Oral Patient Refused/Not Given 08/13/20 2057)  sodium chloride 0.9 % bolus 1,000 mL (0 mLs Intravenous Stopped 08/13/20 2007)  ondansetron (ZOFRAN) injection 4 mg (4 mg Intravenous Given 08/13/20 1902)  morphine 4 MG/ML injection 4 mg (4 mg Intravenous Given 08/13/20 1902)  iohexol (OMNIPAQUE) 300 MG/ML solution 100 mL (100 mLs Intravenous Contrast Given 08/13/20 1848)    ED Course  I have reviewed the triage vital signs and the nursing notes.  Pertinent labs & imaging results that were available during my care of the patient were reviewed by me and considered in my medical decision making (see chart for details).    MDM Rules/Calculators/A&P                           35 year old female presents with concern for epigastric pain with radiation to the back and fever.   CT abdomen pelvis shows no evidence of cholecystitis, appendicitis, diverticulitis, other intra-abdominal abscess.  Urinalysis is not consistent with pyelonephritis.  Labs do not show sign of pancreatitis.  Chest x-ray shows no evidence of pneumonia.  Delta troponins were negative, no sign of myocarditis, pericarditis or ACS.  Discussed that symptomatic cholelithiasis is possible, but would not explain her fever, and would expect to see some sort of inflammation on CT scan or tenderness on exam if cholecystitis was the etiology.  Description of pain is more central epigastric. CT completed given severity of pain described and fever.   Suspect likely gastritis and viral syndrome. COVID testing is negative.  Recommend continued supportive care, zofran, PPI, discussed reasons to return.    Final Clinical Impression(s) / ED Diagnoses Final diagnoses:  Epigastric pain  Fever, unspecified fever cause  Acute gastritis without hemorrhage, unspecified gastritis type    Rx / DC Orders ED Discharge Orders          Ordered    ondansetron (ZOFRAN) 4 MG tablet  Every 6 hours        08/13/20 2054    pantoprazole  (PROTONIX) 20 MG tablet  Daily        08/13/20 2054             Alvira Monday, MD 08/13/20 2359

## 2020-09-25 NOTE — Progress Notes (Signed)
Assessment and Plan:  [name] [name] was seen today for acute visit and knee pain.  Diagnoses and all orders for this visit:  Right upper quadrant abdominal pain -     US Abdomen Limited RUQ (LIVER/GB); Future - Monitor symptoms, if gallbladder U/S is normal would recommend observation as pain is very sporadic at this point.  If any changes she is to call the office.   Acute pain of right knee -     DG Knee Complete 4 Views Right; Future - Pt advised to wear knee brace when doing bootcamp as this exercise class is hard on the knee joint.  To use NSAIDs and ice for pain. Will base follow up treatment pending Xray result.   Sebaceous cyst       - Discussed I&D versus warm compresses and monitoring.  Pt decides to monitor at this point.  If grows in size, becomes tender or does not resolve she will return to the office.   Further disposition pending results of labs. Discussed med's effects and SE's.   Over 30 minutes of exam, counseling, chart review, and critical decision making was performed.   Future Appointments  Date Time Provider Department Center  05/01/2021 10:00 AM Revonda Humphrey, NP [facility] None    ------------------------------------------------------------------------------------------------------------------   HPI BP 102/78   Pulse (!) 56   Temp (!) 97.5 F (36.4 C)   Wt 168 lb 6.4 oz (76.4 kg)   SpO2 97%   BMI 25.61 kg/m  34 y.o.female presents for follow up after  ER visit  08/13/2020 for abdominal pain in upper quardrants with nausea and diarrhea.  Had a negative abdominal CT.  Currently having no nausea, vomiting, diarrhea or constipation but has intermittent abdominal bloating and some abdominal discomfort in upper right quadrant.    Right knee crepitus and pain with movement /flexion x 3 years but appears to be worsening.   When she walks up the stairs she hears loud clicking. When she is doing lunges she has increased pain in front and top of knee. Played volleyball in  high school does not remember injury. Pt does do burn bootcamp 5 days a week.   Pt has small knot on top of head which has been present several months. Nontender, no drainage. Had a similar area on scalp previously which spontaneously resolved.      No Known Allergies   Current Outpatient Medications on File Prior to Visit  Medication Sig   Cholecalciferol 1.25 MG (50000 UT) capsule Take one tablet by mouth three days a week for twelve weeks.   levonorgestrel (KYLEENA) 19.5 MG IUD Kyleena 17.5 mcg/24 hrs (67yrs) 19.5mg  intrauterine device  Take 1 device by intrauterine route.   Multiple Vitamins-Minerals (MULTIVITAMIN GUMMIES ADULT PO) Take 2 tablets by mouth daily.   ondansetron (ZOFRAN) 4 MG tablet Take 1 tablet (4 mg total) by mouth every 6 (six) hours.   pantoprazole (PROTONIX) 20 MG tablet Take 1 tablet (20 mg total) by mouth daily for 14 days.   No current facility-administered medications on file prior to visit.    ROS: all negative except above.   Physical Exam:  BP 102/78   Pulse (!) 56   Temp (!) 97.5 F (36.4 C)   Wt 168 lb 6.4 oz (76.4 kg)   SpO2 97%   BMI 25.61 kg/m   General Appearance: Well nourished, in no apparent distress. Eyes: PERRLA, EOMs, conjunctiva no swelling or erythema Sinuses: No Frontal/maxillary tenderness ENT/Mouth: Ext aud canals clear, TMs  without erythema, bulging. No erythema, swelling, or exudate on post pharynx.  Tonsils not swollen or erythematous. Hearing normal.  Neck: Supple, thyroid normal.  Respiratory: Respiratory effort normal, BS equal bilaterally without rales, rhonchi, wheezing or stridor.  Cardio: RRR with no MRGs. Brisk peripheral pulses without edema.  Abdomen: Soft, + BS.  Non tender, no guarding, rebound, hernias, masses. Lymphatics: Non tender without lymphadenopathy.  Musculoskeletal: Full ROM, 5/5 strength, normal gait. Right knee loud clicking and crepitus with extension and flexion Skin: 1 cm sebaceous cyst noted on  scalp, no erythema or drainage Neuro: Cranial nerves intact. Normal muscle tone, no cerebellar symptoms. Sensation intact.  Psych: Awake and oriented X 3, normal affect, Insight and Judgment appropriate.     Revonda Humphrey, NP 4:34 PM [facility]

## 2020-09-26 ENCOUNTER — Other Ambulatory Visit: Payer: Self-pay

## 2020-09-26 ENCOUNTER — Encounter: Payer: Self-pay | Admitting: Nurse Practitioner

## 2020-09-26 ENCOUNTER — Ambulatory Visit (INDEPENDENT_AMBULATORY_CARE_PROVIDER_SITE_OTHER): Payer: BC Managed Care – PPO | Admitting: Nurse Practitioner

## 2020-09-26 VITALS — BP 102/78 | HR 56 | Temp 97.5°F | Wt 168.4 lb

## 2020-09-26 DIAGNOSIS — R1011 Right upper quadrant pain: Secondary | ICD-10-CM | POA: Diagnosis not present

## 2020-09-26 DIAGNOSIS — M25561 Pain in right knee: Secondary | ICD-10-CM | POA: Diagnosis not present

## 2020-09-26 DIAGNOSIS — L723 Sebaceous cyst: Secondary | ICD-10-CM

## 2020-09-26 NOTE — Patient Instructions (Signed)
[facility] [address], [place], [place] [address]  ~5.1 mi [phone] [email or web address] Open 24 hours  Acute Knee Pain, Adult Acute knee pain is sudden and may be caused by damage, swelling, or irritation of the muscles and tissues that support the knee. Pain may result from: A fall. An injury to the knee from twisting motions. A hit to the knee. Infection. Acute knee pain may go away on its own with time and rest. If it does not, your health care provider may order tests to find the cause of the pain. These may include: Imaging tests, such as an X-ray, MRI, CT scan, or ultrasound. Joint aspiration. In this test, fluid is removed from the knee and evaluated. Arthroscopy. In this test, a lighted tube is inserted into the knee and an image is projected onto a TV screen. Biopsy. In this test, a sample of tissue is removed from the body and studied under a microscope. Follow these instructions at home: If you have a knee sleeve or brace:  Wear the knee sleeve or brace as told by your health care provider. Remove it only as told by your health care provider. Loosen it if your toes tingle, become numb, or turn cold and blue. Keep it clean. If the knee sleeve or brace is not waterproof: Do not let it get wet. Cover it with a watertight covering when you take a bath or shower.  Activity Rest your knee. Do not do things that cause pain or make pain worse. Avoid high-impact activities or exercises, such as running, jumping rope, or doing jumping jacks. Work with a physical therapist to make a safe exercise program, as recommended by your health care provider. Do exercises as told by your physical therapist. Managing pain, stiffness, and swelling  If directed, put ice on the affected knee. To do this: If you have a removable knee sleeve or brace, remove it as told by your health care provider. Put ice in a plastic bag. Place a towel between  your skin and the bag. Leave the ice on for 20 minutes, 2-3 times a day. Remove the ice if your skin turns bright red. This is very important. If you cannot feel pain, heat, or cold, you have a greater risk of damage to the area. If directed, use an elastic bandage to put pressure (compression) on your injured knee. This may control swelling, give support, and help with discomfort. Raise (elevate) your knee above the level of your heart while you are sitting or lying down. Sleep with a pillow under your knee.  General instructions Take over-the-counter and prescription medicines only as told by your health care provider. Do not use any products that contain nicotine or tobacco, such as cigarettes, e-cigarettes, and chewing tobacco. If you need help quitting, ask your health care provider. If you are overweight, work with your health care provider and a dietitian to set a weight-loss goal that is healthy and reasonable for you. Extra weight can put pressure on your knee. Pay attention to any changes in your symptoms. Keep all follow-up visits. This is important. Contact a health care provider if: Your knee pain continues, changes, or gets worse. You have a fever along with knee pain. Your knee feels warm to the touch or is red. Your knee buckles or locks up. Get help right away if: Your knee swells, and the swelling becomes worse. You cannot move your knee. You have severe pain in your knee that  cannot be managed with pain medicine. Summary Acute knee pain can be caused by a fall, an injury, an infection, or damage, swelling, or irritation of the tissues that support your knee. Your health care provider may perform tests to find out the cause of the pain. Pay attention to any changes in your symptoms. Relieve your pain with rest, medicines, light activity, and the use of ice. Get help right away if your knee swells, you cannot move your knee, or you have severe pain that cannot be managed  with medicine. This information is not intended to replace advice given to you by your health care provider. Make sure you discuss any questions you have with your healthcare provider. Document Revised: 08/02/2019 Document Reviewed: 08/02/2019 Elsevier Patient Education  2022 [employer].

## 2020-10-10 ENCOUNTER — Other Ambulatory Visit: Payer: Self-pay

## 2020-10-10 ENCOUNTER — Ambulatory Visit (HOSPITAL_BASED_OUTPATIENT_CLINIC_OR_DEPARTMENT_OTHER)
Admission: RE | Admit: 2020-10-10 | Discharge: 2020-10-10 | Disposition: A | Discharge status: Home / Self Care | Payer: BC Managed Care – PPO | Source: Ambulatory Visit | Attending: Nurse Practitioner | Admitting: Nurse Practitioner

## 2020-10-10 DIAGNOSIS — M25561 Pain in right knee: Secondary | ICD-10-CM | POA: Insufficient documentation

## 2020-10-10 DIAGNOSIS — R1011 Right upper quadrant pain: Secondary | ICD-10-CM

## 2020-10-15 ENCOUNTER — Other Ambulatory Visit: Payer: Self-pay | Admitting: Nurse Practitioner

## 2020-10-15 DIAGNOSIS — M25561 Pain in right knee: Secondary | ICD-10-CM

## 2020-10-31 DIAGNOSIS — M25561 Pain in right knee: Secondary | ICD-10-CM | POA: Diagnosis not present

## 2020-11-13 DIAGNOSIS — M2241 Chondromalacia patellae, right knee: Secondary | ICD-10-CM | POA: Diagnosis not present

## 2021-05-01 ENCOUNTER — Encounter: Payer: BC Managed Care – PPO | Admitting: Adult Health Nurse Practitioner

## 2021-05-01 ENCOUNTER — Encounter: Payer: BC Managed Care – PPO | Admitting: Nurse Practitioner

## 2021-05-13 NOTE — Progress Notes (Deleted)
COMPLETE PHYSICAL ? ?Assessment and Plan: ? ?Diagnoses and all orders for this visit: ? ?Encounter for routine adult medical exam with abnormal findings ?Yearly ? ?Vitamin D deficiency ?Taking Vitamin D supplementation at this time ? Vitamin D level ? ?Chronic pain of right knee ?Doing well at this time ? ?Medication management ?Continued ? ?Screening for thyroid disorder ?-TSH ? ?Hyperlipidemia, unspecified ?Continue diet and exercise ?Check Lipid panel ? ?Abnormal Glucose ?Continue diet and Exercise ?- A1c ? ?Screening for blood or protein in urine ?-UA Routine with reflex microscopic ?- Microalbumin/creatinine ratio ? ?BMI 26.0-26.9 ?Discussed dietary and exercise modifications ? ? ? ?Discussed med's effects and SE's. Screening labs and tests as requested with regular follow-up as recommended. ?Over 40 minutes of face to face interview, exam, counseling, chart review, and complex, high level critical decision making was performed this visit.  ? ?HPI  ?36 y.o. female  presents for a complete physical and follow up.  ? ?LMP: 04/24/20, has IUD 03/2019 at last GYN visit ? 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  She I married, husband [name] [name], no children.  No plans for children at this time. ? ?Her blood pressure has been controlled at home, today their BP is   ?She does not workout, since the pandemic, declines Pt for knee. ? She denies chest pain, shortness of breath, dizziness.  ?She has allergy history, on allegra year round that helps.  ? ?Lab Results  ?Component Value Date  ? GFRNONAA >60 08/13/2020  ? ?BMI is There is no height or weight on file to calculate BMI., she is working on diet and exercise. She has been eating better and not going out due to the pandemic.  ?Wt Readings from Last 3 Encounters:  ?09/26/20 168 lb 6.4 oz (76.4 kg)  ?05/01/20 172 lb (78 kg)  ?05/02/19 175 lb 3.2 oz (79.5 kg)  ? ? ? ?Current Medications:  ?Current Outpatient Medications on File  Prior to Visit  ?Medication Sig Dispense Refill  ? Cholecalciferol 1.25 MG (50000 UT) capsule Take one tablet by mouth three days a week for twelve weeks. 36 capsule 0  ? levonorgestrel (KYLEENA) 19.5 MG IUD Kyleena 17.5 mcg/24 hrs (60yrs) 19.5mg  intrauterine device ? Take 1 device by intrauterine route.    ? Multiple Vitamins-Minerals (MULTIVITAMIN GUMMIES ADULT PO) Take 2 tablets by mouth daily.    ? ?No current facility-administered medications on file prior to visit.  ? ?Allergies:  ?No Known Allergies  ? ?Medical History:  ?She has Right upper quadrant abdominal pain; Acute pain of right knee; and Sebaceous cyst on their problem list. ? ?Health Maintenance:   ?Tetanus: August 2017 ?Pneumovax: N/A ?Prevnar 13: N/A ?Flu vaccine: declines ?Zostavax: N/A ?Has completed HPV ? ?LMP: IUD ?Pap: Dr. Perlie Gold ?MGM: N/A ?DEXA:N/A ?Colonoscopy: N/A ?EGD:N/A ? ?Eye exam: lasix surgery ?Dentist: 03/2020 ?Patient Care Team: ?Lucky Cowboy, MD as PCP - General (Internal Medicine) ? ?Surgical History:  ?She has a past surgical history that includes Cervical biopsy w/ loop electrode excision (2012) and Hernia repair (1996). ?Family History:  ?Herfamily history includes Asthma in her sister; Hypertension in her mother; Kidney Stones in her mother and sister; Migraines in her mother. ?Social History:  ?She reports that she has never smoked. She has never used smokeless tobacco. She reports current alcohol use. She reports that she does not use drugs. ? ?Review of Systems: ?Review of Systems  ?Constitutional: Negative.  Negative for chills and  fever.  ?HENT: Negative.  Negative for congestion, hearing loss, sinus pain, sore throat and tinnitus.   ?Eyes: Negative.  Negative for blurred vision and double vision.  ?Respiratory: Negative.  Negative for cough, hemoptysis, sputum production, shortness of breath and wheezing.   ?Cardiovascular: Negative.  Negative for chest pain, palpitations and leg swelling.  ?Gastrointestinal:  Negative.  Negative for abdominal pain, constipation, diarrhea, heartburn, nausea and vomiting.  ?Genitourinary: Negative.  Negative for dysuria and urgency.  ?Musculoskeletal:  Positive for joint pain. Negative for back pain, falls, myalgias and neck pain.  ?Skin: Negative.  Negative for rash.  ?Neurological:  Negative for dizziness, tingling, tremors, weakness and headaches.  ?Endo/Heme/Allergies:  Does not bruise/bleed easily.  ?Psychiatric/Behavioral:  Negative for depression and suicidal ideas. The patient is not nervous/anxious and does not have insomnia.   ? ?Physical Exam: ?Estimated body mass index is 25.61 kg/m? as calculated from the following: ?  Height as of 05/01/20: 5\' 8"  (1.727 m). ?  Weight as of 09/26/20: 168 lb 6.4 oz (76.4 kg). ?There were no vitals taken for this visit. ?General Appearance: Well nourished, in no apparent distress.  ?Eyes: PERRLA, EOMs, conjunctiva no swelling or erythema, normal fundi and vessels.  ?Sinuses: No Frontal/maxillary tenderness  ?ENT/Mouth: Ext aud canals clear, normal light reflex with TMs without erythema, bulging. Good dentition. No erythema, swelling, or exudate on post pharynx. Tonsils not swollen or erythematous. Hearing normal.  ?Neck: Supple, thyroid normal. No bruits  ?Respiratory: Respiratory effort normal, BS equal bilaterally without rales, rhonchi, wheezing or stridor.  ?Cardio: RRR without murmurs, rubs or gallops. Brisk peripheral pulses without edema.  ?Chest: symmetric, with normal excursions and percussion.  ?Breasts: defer GYN ?Abdomen: Soft, nontender, no guarding, rebound, hernias, masses, or organomegaly.  ?Lymphatics: Non tender without lymphadenopathy.  ?Genitourinary: defer GYN ?Musculoskeletal: Full ROM all peripheral extremities,5/5 strength, and normal gait. Patient is able to ambulate well.   ?Gait is not  antalgic. ?right knee with crepitus and tenderness noted anterior knee, patella laxity noted, no effusion, ACL stable, MCL stable, LCL  stable and McMurray's negative ?Skin: Warm, dry without rashes, lesions, ecchymosis. ?Neuro: Cranial nerves intact, reflexes equal bilaterally. Normal muscle tone, no cerebellar symptoms. Sensation intact.  ?Psych: Awake and oriented X 3, normal affect, Insight and Judgment appropriate.  ? ?EKG: defer ? ?[name] [name] W Sovereign Ramiro ?1:13 PM ?[facility] ? ?

## 2021-05-14 ENCOUNTER — Encounter: Payer: 59 | Admitting: Nurse Practitioner

## 2021-05-14 DIAGNOSIS — R7309 Other abnormal glucose: Secondary | ICD-10-CM

## 2021-05-14 DIAGNOSIS — E785 Hyperlipidemia, unspecified: Secondary | ICD-10-CM

## 2021-05-14 DIAGNOSIS — Z0001 Encounter for general adult medical examination with abnormal findings: Secondary | ICD-10-CM

## 2021-05-14 DIAGNOSIS — Z1329 Encounter for screening for other suspected endocrine disorder: Secondary | ICD-10-CM

## 2021-05-14 DIAGNOSIS — Z79899 Other long term (current) drug therapy: Secondary | ICD-10-CM

## 2021-05-14 DIAGNOSIS — Z1389 Encounter for screening for other disorder: Secondary | ICD-10-CM

## 2021-05-14 DIAGNOSIS — E559 Vitamin D deficiency, unspecified: Secondary | ICD-10-CM

## 2021-05-14 DIAGNOSIS — G8929 Other chronic pain: Secondary | ICD-10-CM

## 2021-05-28 NOTE — Progress Notes (Signed)
COMPLETE PHYSICAL ? ?Assessment and Plan: ? ?Diagnoses and all orders for this visit: ? ?Encounter for routine adult medical exam with abnormal findings ?Yearly ? ?Vitamin D deficiency ?Taking Vitamin D supplementation at this time ? Vitamin D level ? ?Chronic pain of right knee ?Doing well at this time ? ?Medication management ?Magnesium ? ?Screening for thyroid disorder ?-TSH ? ?Hyperlipidemia, unspecified ?Continue diet and exercise ?Check Lipid panel ?- CMP ? ?Abnormal Glucose ?Continue diet and Exercise ?- A1c ? ?Screening for Anemia ?- CBC ? ?Screening for blood or protein in urine ?-UA Routine with reflex microscopic ?- Microalbumin/creatinine ratio ? ?BMI 23.0-23.9 ?Discussed dietary and exercise modifications ? ? ? ?Discussed med's effects and SE's. Screening labs and tests as requested with regular follow-up as recommended. ?Over 40 minutes of face to face interview, exam, counseling, chart review, and complex, high level critical decision making was performed this visit.  ? ?HPI  ?36 y.o. female  presents for a complete physical and follow up.  ? ?LMP: 04/24/20, has IUD 03/2019 at last GYN visit ? 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  She I married, husband Ray, no children.  No plans for children at this time. ? ?Her blood pressure has been controlled at home, today their BP is BP: 108/70  ?BP Readings from Last 3 Encounters:  ?05/30/21 108/70  ?09/26/20 102/78  ?08/13/20 107/78  ? ? ?She does not workout, since the pandemic, declines Pt for knee. ? She denies chest pain, shortness of breath, dizziness.  ?She has allergy history, on allegra year round that helps.  ? ?Lab Results  ?Component Value Date  ? GFRNONAA >60 08/13/2020  ? ?BMI is Body mass index is 23.35 kg/m?., she is working on diet and exercise. She has been eating better and exercising more ?Wt Readings from Last 3 Encounters:  ?05/30/21 157 lb (71.2 kg)  ?09/26/20 168 lb 6.4 oz (76.4 kg)   ?05/01/20 172 lb (78 kg)  ? ? ? ?Current Medications:  ?Current Outpatient Medications on File Prior to Visit  ?Medication Sig Dispense Refill  ? levonorgestrel (KYLEENA) 19.5 MG IUD Kyleena 17.5 mcg/24 hrs (59yrs) 19.5mg  intrauterine device ? Take 1 device by intrauterine route.    ? Multiple Vitamins-Minerals (MULTIVITAMIN GUMMIES ADULT PO) Take 2 tablets by mouth daily.    ? Cholecalciferol 1.25 MG (50000 UT) capsule Take one tablet by mouth three days a week for twelve weeks. 36 capsule 0  ? ?No current facility-administered medications on file prior to visit.  ? ?Allergies:  ?No Known Allergies  ? ?Medical History:  ?She has Right upper quadrant abdominal pain; Acute pain of right knee; and Sebaceous cyst on their problem list. ? ?Health Maintenance:   ?Tetanus: August 2017 ?Pneumovax: N/A ?Prevnar 13: N/A ?Flu vaccine: declines ?Zostavax: N/A ?Has completed HPV ? ?LMP: IUD, 05/06/21 ?Pap: Dr. Perlie Gold, 04/02/21 ?MGM: N/A ?DEXA:N/A ?Colonoscopy: N/A ?EGD:N/A ? ?Eye exam: lasix surgery ?Dentist: 03/2020 ?Patient Care Team: ?Lucky Cowboy, MD as PCP - General (Internal Medicine) ? ?Surgical History:  ?She has a past surgical history that includes Cervical biopsy w/ loop electrode excision (2012) and Hernia repair (1996). ?Family History:  ?Herfamily history includes Asthma in her sister; Hypertension in her mother; Kidney Stones in her mother and sister; Migraines in her mother. ?Social History:  ?She reports that she has never smoked. She has never used smokeless tobacco. She reports current alcohol use. She reports that she does not use  drugs. ? ?Review of Systems: ?Review of Systems  ?Constitutional: Negative.  Negative for chills and fever.  ?HENT: Negative.  Negative for congestion, hearing loss, sinus pain, sore throat and tinnitus.   ?Eyes: Negative.  Negative for blurred vision and double vision.  ?Respiratory: Negative.  Negative for cough, hemoptysis, sputum production, shortness of breath and wheezing.    ?Cardiovascular: Negative.  Negative for chest pain, palpitations and leg swelling.  ?Gastrointestinal: Negative.  Negative for abdominal pain, constipation, diarrhea, heartburn, nausea and vomiting.  ?Genitourinary: Negative.  Negative for dysuria and urgency.  ?Musculoskeletal:  Positive for joint pain. Negative for back pain, falls, myalgias and neck pain.  ?Skin: Negative.  Negative for rash.  ?Neurological:  Negative for dizziness, tingling, tremors, weakness and headaches.  ?Endo/Heme/Allergies:  Does not bruise/bleed easily.  ?Psychiatric/Behavioral:  Negative for depression and suicidal ideas. The patient is not nervous/anxious and does not have insomnia.   ? ?Physical Exam: ?Estimated body mass index is 23.35 kg/m? as calculated from the following: ?  Height as of this encounter: 5' 8.75" (1.746 m). ?  Weight as of this encounter: 157 lb (71.2 kg). ?BP 108/70   Pulse 71   Temp (!) 97.5 ?F (36.4 ?C)   Ht 5' 8.75" (1.746 m)   Wt 157 lb (71.2 kg)   SpO2 99%   BMI 23.35 kg/m?  ?General Appearance: Well nourished, in no apparent distress.  ?Eyes: PERRLA, EOMs, conjunctiva no swelling or erythema, normal fundi and vessels.  ?Sinuses: No Frontal/maxillary tenderness  ?ENT/Mouth: Ext aud canals clear, normal light reflex with TMs without erythema, bulging. Good dentition. No erythema, swelling, or exudate on post pharynx. Tonsils not swollen or erythematous. Hearing normal.  ?Neck: Supple, thyroid normal. No bruits  ?Respiratory: Respiratory effort normal, BS equal bilaterally without rales, rhonchi, wheezing or stridor.  ?Cardio: RRR without murmurs, rubs or gallops. Brisk peripheral pulses without edema.  ?Chest: symmetric, with normal excursions and percussion.  ?Breasts: defer GYN ?Abdomen: Soft, nontender, no guarding, rebound, hernias, masses, or organomegaly.  ?Lymphatics: Non tender without lymphadenopathy.  ?Genitourinary: defer GYN ?Musculoskeletal: Full ROM all peripheral extremities,5/5 strength,  and normal gait. Patient is able to ambulate well.   ?Gait is not  antalgic. ?Skin: Warm, dry without rashes, lesions, ecchymosis. ?Neuro: Cranial nerves intact, reflexes equal bilaterally. Normal muscle tone, no cerebellar symptoms. Sensation intact.  ?Psych: Awake and oriented X 3, normal affect, Insight and Judgment appropriate.  ? ?EKG: defer ? ?Deanta Mincey W Yutaka Holberg ?10:06 AM ?[facility] ? ?

## 2021-05-30 ENCOUNTER — Ambulatory Visit (INDEPENDENT_AMBULATORY_CARE_PROVIDER_SITE_OTHER): Payer: 59 | Admitting: Nurse Practitioner

## 2021-05-30 ENCOUNTER — Encounter: Payer: Self-pay | Admitting: Nurse Practitioner

## 2021-05-30 VITALS — BP 108/70 | HR 71 | Temp 97.5°F | Ht 68.75 in | Wt 157.0 lb

## 2021-05-30 DIAGNOSIS — Z0000 Encounter for general adult medical examination without abnormal findings: Secondary | ICD-10-CM | POA: Diagnosis not present

## 2021-05-30 DIAGNOSIS — G8929 Other chronic pain: Secondary | ICD-10-CM

## 2021-05-30 DIAGNOSIS — Z13220 Encounter for screening for lipoid disorders: Secondary | ICD-10-CM

## 2021-05-30 DIAGNOSIS — Z1389 Encounter for screening for other disorder: Secondary | ICD-10-CM | POA: Diagnosis not present

## 2021-05-30 DIAGNOSIS — Z1329 Encounter for screening for other suspected endocrine disorder: Secondary | ICD-10-CM

## 2021-05-30 DIAGNOSIS — Z131 Encounter for screening for diabetes mellitus: Secondary | ICD-10-CM

## 2021-05-30 DIAGNOSIS — E559 Vitamin D deficiency, unspecified: Secondary | ICD-10-CM | POA: Diagnosis not present

## 2021-05-30 DIAGNOSIS — Z0001 Encounter for general adult medical examination with abnormal findings: Secondary | ICD-10-CM

## 2021-05-30 DIAGNOSIS — Z130 Encounter for screening for diseases of the blood and blood-forming organs and certain disorders involving the immune mechanism: Secondary | ICD-10-CM

## 2021-05-30 DIAGNOSIS — Z6826 Body mass index (BMI) 26.0-26.9, adult: Secondary | ICD-10-CM

## 2021-05-30 DIAGNOSIS — R7309 Other abnormal glucose: Secondary | ICD-10-CM

## 2021-05-30 DIAGNOSIS — Z79899 Other long term (current) drug therapy: Secondary | ICD-10-CM

## 2021-05-30 DIAGNOSIS — Z6823 Body mass index (BMI) 23.0-23.9, adult: Secondary | ICD-10-CM

## 2021-05-31 ENCOUNTER — Encounter: Payer: Self-pay | Admitting: Nurse Practitioner

## 2021-05-31 LAB — CBC WITH DIFFERENTIAL/PLATELET
Absolute Monocytes: 322 cells/uL (ref 200–950)
Basophils Absolute: 18 cells/uL (ref 0–200)
Basophils Relative: 0.4 %
Eosinophils Absolute: 322 cells/uL (ref 15–500)
Eosinophils Relative: 7 %
HCT: 40.7 % (ref 35.0–45.0)
Hemoglobin: 14 g/dL (ref 11.7–15.5)
Lymphs Abs: 1486 cells/uL (ref 850–3900)
MCH: 32.9 pg (ref 27.0–33.0)
MCHC: 34.4 g/dL (ref 32.0–36.0)
MCV: 95.5 fL (ref 80.0–100.0)
MPV: 13.1 fL — ABNORMAL HIGH (ref 7.5–12.5)
Monocytes Relative: 7 %
Neutro Abs: 2452 cells/uL (ref 1500–7800)
Neutrophils Relative %: 53.3 %
Platelets: 162 10*3/uL (ref 140–400)
RBC: 4.26 10*6/uL (ref 3.80–5.10)
RDW: 11.9 % (ref 11.0–15.0)
Total Lymphocyte: 32.3 %
WBC: 4.6 10*3/uL (ref 3.8–10.8)

## 2021-05-31 LAB — COMPLETE METABOLIC PANEL WITH GFR
AG Ratio: 1.3 (calc) (ref 1.0–2.5)
ALT: 16 U/L (ref 6–29)
AST: 18 U/L (ref 10–30)
Albumin: 4.2 g/dL (ref 3.6–5.1)
Alkaline phosphatase (APISO): 54 U/L (ref 31–125)
BUN: 16 mg/dL (ref 7–25)
CO2: 25 mmol/L (ref 20–32)
Calcium: 9.1 mg/dL (ref 8.6–10.2)
Chloride: 105 mmol/L (ref 98–110)
Creat: 0.86 mg/dL (ref 0.50–0.97)
Globulin: 3.3 g/dL (calc) (ref 1.9–3.7)
Glucose, Bld: 96 mg/dL (ref 65–99)
Potassium: 4.1 mmol/L (ref 3.5–5.3)
Sodium: 138 mmol/L (ref 135–146)
Total Bilirubin: 1 mg/dL (ref 0.2–1.2)
Total Protein: 7.5 g/dL (ref 6.1–8.1)
eGFR: 91 mL/min/{1.73_m2} (ref >=60)

## 2021-05-31 LAB — MICROALBUMIN / CREATININE URINE RATIO
Creatinine, Urine: 247 mg/dL (ref 20–275)
Microalb Creat Ratio: 4 mcg/mg creat (ref <30)
Microalb, Ur: 1 mg/dL

## 2021-05-31 LAB — URINALYSIS, ROUTINE W REFLEX MICROSCOPIC
Bilirubin Urine: NEGATIVE
Glucose, UA: NEGATIVE
Hgb urine dipstick: NEGATIVE
Ketones, ur: NEGATIVE
Leukocytes,Ua: NEGATIVE
Nitrite: NEGATIVE
Protein, ur: NEGATIVE
Specific Gravity, Urine: 1.027 (ref 1.001–1.035)
pH: 6 (ref 5.0–8.0)

## 2021-05-31 LAB — LIPID PANEL
Cholesterol: 169 mg/dL (ref <200)
HDL: 57 mg/dL (ref >=50)
LDL Cholesterol (Calc): 95 mg/dL (calc)
Non-HDL Cholesterol (Calc): 112 mg/dL (calc) (ref <130)
Total CHOL/HDL Ratio: 3 (calc) (ref <5.0)
Triglycerides: 80 mg/dL (ref <150)

## 2021-05-31 LAB — HEMOGLOBIN A1C
Hgb A1c MFr Bld: 4.5 % of total Hgb (ref <5.7)
Mean Plasma Glucose: 82 mg/dL
eAG (mmol/L): 4.6 mmol/L

## 2021-05-31 LAB — TSH: TSH: 1.01 mIU/L

## 2021-05-31 LAB — VITAMIN D 25 HYDROXY (VIT D DEFICIENCY, FRACTURES): Vit D, 25-Hydroxy: 40 ng/mL (ref 30–100)

## 2021-05-31 LAB — MAGNESIUM: Magnesium: 2 mg/dL (ref 1.5–2.5)

## 2021-11-17 ENCOUNTER — Encounter: Payer: Self-pay | Admitting: Nurse Practitioner

## 2021-11-24 NOTE — Progress Notes (Deleted)
Assessment and Plan:  There are no diagnoses linked to this encounter.    Further disposition pending results of labs. Discussed med's effects and SE's.   Over 30 minutes of exam, counseling, chart review, and critical decision making was performed.   Future Appointments  Date Time Provider [place]  11/25/2021  1:45 PM Alycia Rossetti, NP [facility] None  06/02/2022 10:00 AM Alycia Rossetti, NP [facility] None    ------------------------------------------------------------------------------------------------------------------   HPI There were no vitals taken for this visit. 36 y.o.female presents for  No past medical history on file.   No Known Allergies  Current Outpatient Medications on File Prior to Visit  Medication Sig   Cholecalciferol 1.25 MG (50000 UT) capsule Take one tablet by mouth three days a week for twelve weeks.   levonorgestrel (KYLEENA) 19.5 MG IUD Kyleena 17.5 mcg/24 hrs (72yrs) 19.5mg  intrauterine device  Take 1 device by intrauterine route.   Multiple Vitamins-Minerals (MULTIVITAMIN GUMMIES ADULT PO) Take 2 tablets by mouth daily.   No current facility-administered medications on file prior to visit.    ROS: all negative except above.   Physical Exam:  There were no vitals taken for this visit.  General Appearance: Well nourished, in no apparent distress. Eyes: PERRLA, EOMs, conjunctiva no swelling or erythema Sinuses: No Frontal/maxillary tenderness ENT/Mouth: Ext aud canals clear, TMs without erythema, bulging. No erythema, swelling, or exudate on post pharynx.  Tonsils not swollen or erythematous. Hearing normal.  Neck: Supple, thyroid normal.  Respiratory: Respiratory effort normal, BS equal bilaterally without rales, rhonchi, wheezing or stridor.  Cardio: RRR with no MRGs. Brisk peripheral pulses without edema.  Abdomen: Soft, + BS.  Non tender, no guarding, rebound, hernias, masses. Lymphatics: Non tender without lymphadenopathy.   Musculoskeletal: Full ROM, 5/5 strength, normal gait.  Skin: Warm, dry without rashes, lesions, ecchymosis.  Neuro: Cranial nerves intact. Normal muscle tone, no cerebellar symptoms. Sensation intact.  Psych: Awake and oriented X 3, normal affect, Insight and Judgment appropriate.     Alycia Rossetti, NP 12:24 PM [facility]

## 2021-11-25 ENCOUNTER — Ambulatory Visit (INDEPENDENT_AMBULATORY_CARE_PROVIDER_SITE_OTHER): Payer: 59 | Admitting: Nurse Practitioner

## 2021-11-25 ENCOUNTER — Ambulatory Visit: Payer: 59 | Admitting: Nurse Practitioner

## 2021-11-25 VITALS — BP 124/78 | HR 59 | Temp 97.5°F | Ht 68.75 in | Wt 172.0 lb

## 2021-11-25 DIAGNOSIS — Z79899 Other long term (current) drug therapy: Secondary | ICD-10-CM | POA: Diagnosis not present

## 2021-11-25 DIAGNOSIS — L723 Sebaceous cyst: Secondary | ICD-10-CM

## 2021-11-25 NOTE — Progress Notes (Signed)
Assessment and Plan:  [name] [name] was seen today for a follow up.  Diagnoses and all order for this visit:  1. Sebaceous cyst Resolved. Low suspicion for cyst returning but may need prophylactic treatment in future. Possible removal of pocket if area consistently reoccurs. Apply warm compress if noticed. Continue to monitor   2. Medication management All medications discussed and reviewed in full. All questions and concerns regarding medications addressed.    Notify office for further evaluation and treatment, questions or concerns if s/s fail to improve. The risks and benefits of my recommendations, as well as other treatment options were discussed with the patient today. Questions were answered.  Further disposition pending results of labs. Discussed med's effects and SE's.    Over 15 minutes of exam, counseling, chart review, and critical decision making was performed.   Future Appointments  Date Time Provider Department Center  06/02/2022 10:00 AM Raynelle Dick, NP [facility] None    ------------------------------------------------------------------------------------------------------------------   HPI BP 124/78   Pulse (!) 59   Temp (!) 97.5 F (36.4 C)   Ht 5' 8.75" (1.746 m)   Wt 172 lb (78 kg)   SpO2 99%   BMI 25.59 kg/m   36 y.o.female presents for evaluation of sebaceous cyst on the head.  States the area has been present for 4 years, had no changes until 1 month ago when she noticed a pustule and raised bump.  She opened the pustule and the area has now since drained and is healing.  She has not been able to obtain any drainage from the area in 1 day.  She has been using an old antibiotic (amoxicillin) for tmt.  Denies fever, chills, N/V, abd pain, fatigue.  No past medical history on file.   No Known Allergies  Current Outpatient Medications on File Prior to Visit  Medication Sig   Cholecalciferol 1.25 MG (50000 UT) capsule Take one tablet by  mouth three days a week for twelve weeks.   levonorgestrel (KYLEENA) 19.5 MG IUD Kyleena 17.5 mcg/24 hrs (46yrs) 19.5mg  intrauterine device  Take 1 device by intrauterine route.   Multiple Vitamins-Minerals (MULTIVITAMIN GUMMIES ADULT PO) Take 2 tablets by mouth daily.   No current facility-administered medications on file prior to visit.    ROS: all negative except what is noted in the HPI.   Physical Exam:  BP 124/78   Pulse (!) 59   Temp (!) 97.5 F (36.4 C)   Ht 5' 8.75" (1.746 m)   Wt 172 lb (78 kg)   SpO2 99%   BMI 25.59 kg/m   General Appearance: NAD.  Awake, conversant and cooperative. Eyes: PERRLA, EOMs intact.  Sclera white.  Conjunctiva without erythema. Sinuses: No frontal/maxillary tenderness.  No nasal discharge. Nares patent.  ENT/Mouth: Ext aud canals clear.  Bilateral TMs w/DOL and without erythema or bulging. Hearing intact.  Posterior pharynx without swelling or exudate.  Tonsils without swelling or erythema.  Neck: Supple.  No masses, nodules or thyromegaly. Respiratory: Effort is regular with non-labored breathing. Breath sounds are equal bilaterally without rales, rhonchi, wheezing or stridor.  Cardio: RRR with no MRGs. Brisk peripheral pulses without edema.  Abdomen: Active BS in all four quadrants.  Soft and non-tender without guarding, rebound tenderness, hernias or masses. Lymphatics: Non tender without lymphadenopathy.  Musculoskeletal: Full ROM, 5/5 strength, normal ambulation.  No clubbing or cyanosis. Skin: Center forehead just behind scalp line with flat dry scab.  No raised, no nodule, no erythema.  Non-tender  to touch. Hair intact. Appropriate color for ethnicity. Warm. Neuro: CN II-XII grossly normal. Normal muscle tone without cerebellar symptoms and intact sensation.   Psych: AO X 3,  appropriate mood and affect, insight and judgment.     Darrol Jump, NP 2:24 PM [facility]

## 2022-05-18 ENCOUNTER — Encounter: Payer: 59 | Admitting: Nurse Practitioner

## 2022-06-02 ENCOUNTER — Encounter: Payer: 59 | Admitting: Nurse Practitioner

## 2022-06-09 NOTE — Progress Notes (Unsigned)
 COMPLETE PHYSICAL  Assessment and Plan:  Diagnoses and all orders for this visit:  Encounter for routine adult medical exam with abnormal findings Yearly  Vitamin D deficiency Taking Vitamin D supplementation at this time  Vitamin D level  Chronic pain of right knee Doing well at this time  Medication management Magnesium  Screening for thyroid disorder -TSH  Hyperlipidemia, unspecified Continue diet and exercise Check Lipid panel - CMP  Abnormal Glucose Continue diet and Exercise - A1c  Screening for Anemia - CBC  Screening for blood or protein in urine -UA Routine with reflex microscopic - Microalbumin/creatinine ratio  Overweight BMI 27 Long discussion about weight loss, diet, and exercise Recommended diet heavy in fruits and veggies and low in animal meats, cheeses, and dairy products, appropriate calorie intake Patient will work on decreasing saturated fats and simple carbs, increase lean protein and exercise Follow up at next visit     Discussed med's effects and SE's. Screening labs and tests as requested with regular follow-up as recommended. Over 40 minutes of face to face interview, exam, counseling, chart review, and complex, high level critical decision making was performed this visit.   HPI  37 y.o. female  presents for a complete physical and follow up.   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  She is married, husband Ray, no children.  No plans for children at this time.  Her blood pressure has been controlled at home, today their BP is BP: 100/68  BP Readings from Last 3 Encounters:  06/10/22 100/68  11/25/21 124/78  05/30/21 108/70  She does not workout, since the pandemic, declines Pt for knee.  She denies chest pain, shortness of breath, dizziness.   She has allergy history, on allegra year round that helps.   BMI is Body mass index is  27.02 kg/m., she is working on diet and exercise. She has not been exercising or watching her diet Wt Readings from Last 3 Encounters:  06/10/22 179 lb (81.2 kg)  11/25/21 172 lb (78 kg)  05/30/21 157 lb (71.2 kg)   She has a history of abnormal glucose but A1c's have been in normal range. Last A1c was: Lab Results  Component Value Date   HGBA1C 4.5 05/30/2021     Current Medications:  Current Outpatient Medications on File Prior to Visit  Medication Sig Dispense Refill   levonorgestrel (KYLEENA) 19.5 MG IUD Kyleena 17.5 mcg/24 hrs (73yrs) 19.5mg  intrauterine device  Take 1 device by intrauterine route.     Multiple Vitamins-Minerals (MULTIVITAMIN GUMMIES ADULT PO) Take 2 tablets by mouth daily.     Cholecalciferol 1.25 MG (50000 UT) capsule Take one tablet by mouth three days a week for twelve weeks. (Patient not taking: Reported on 06/10/2022) 36 capsule 0   No current facility-administered medications on file prior to visit.   Allergies:  No Known Allergies   Medical History:  She has Right upper quadrant abdominal pain; Acute pain of right knee; and Sebaceous cyst on their problem list.  Health Maintenance  Topic Date Due   HIV Screening  Never done   Hepatitis C Screening  Never done   DTaP/Tdap/Td (1 - Tdap) Never done   PAP SMEAR-Modifier  10/21/2017   COVID-19 Vaccine (3 - Mixed Product risk series) 06/30/2019   INFLUENZA VACCINE  10/01/2022   HPV VACCINES  Aged [employer]  exam: lasix surgery Dentist: 03/2020 Patient Care Team: Lucky Cowboy, MD as PCP - General (Internal Medicine)  Surgical History:  She has a past surgical history that includes Cervical biopsy w/ loop electrode excision (2012) and Hernia repair (1996). Family History:  Herfamily history includes Asthma in her sister; Hypertension in her mother; Kidney Stones in her mother and sister; Migraines in her mother. Social History:  She reports that she has never smoked. She has never used  smokeless tobacco. She reports current alcohol use. She reports that she does not use drugs.  Review of Systems: Review of Systems  Constitutional:  Negative for chills and fever.  HENT:  Negative for congestion, hearing loss, sinus pain, sore throat and tinnitus.   Eyes:  Negative for blurred vision and double vision.  Respiratory:  Negative for cough, hemoptysis, sputum production, shortness of breath and wheezing.   Cardiovascular:  Negative for chest pain, palpitations and leg swelling.  Gastrointestinal:  Negative for abdominal pain, constipation, diarrhea, heartburn, nausea and vomiting.  Genitourinary:  Negative for dysuria and urgency.  Musculoskeletal:  Negative for back pain, falls, joint pain, myalgias and neck pain.  Skin:  Negative for rash.  Neurological:  Negative for dizziness, tingling, tremors, weakness and headaches.  Endo/Heme/Allergies:  Does not bruise/bleed easily.  Psychiatric/Behavioral:  Negative for depression and suicidal ideas. The patient is not nervous/anxious and does not have insomnia.     Physical Exam: Estimated body mass index is 27.02 kg/m as calculated from the following:   Height as of this encounter: 5' 8.25" (1.734 m).   Weight as of this encounter: 179 lb (81.2 kg). BP 100/68   Pulse 83   Temp 97.7 F (36.5 C)   Ht 5' 8.25" (1.734 m)   Wt 179 lb (81.2 kg)   LMP 05/27/2022   SpO2 95%   BMI 27.02 kg/m  General Appearance: Well nourished, in no apparent distress.  Eyes: PERRLA, EOMs, conjunctiva no swelling or erythema, normal fundi and vessels.  Sinuses: No Frontal/maxillary tenderness  ENT/Mouth: Ext aud canals clear, normal light reflex with TMs without erythema, bulging. Good dentition. No erythema, swelling, or exudate on post pharynx. Tonsils not swollen or erythematous. Hearing normal.  Neck: Supple, thyroid normal. No bruits  Respiratory: Respiratory effort normal, BS equal bilaterally without rales, rhonchi, wheezing or stridor.   Cardio: RRR without murmurs, rubs or gallops. Brisk peripheral pulses without edema.  Chest: symmetric, with normal excursions and percussion.  Breasts: defer GYN Abdomen: Soft, nontender, no guarding, rebound, hernias, masses, or organomegaly.  Lymphatics: Non tender without lymphadenopathy.  Genitourinary: defer GYN Musculoskeletal: Full ROM all peripheral extremities,5/5 strength, and normal gait. Patient is able to ambulate well.   Gait is not  antalgic. Skin: Warm, dry without rashes, lesions, ecchymosis. Neuro: Cranial nerves intact, reflexes equal bilaterally. Normal muscle tone, no cerebellar symptoms. Sensation intact.  Psych: Awake and oriented X 3, normal affect, Insight and Judgment appropriate.   EKG: defer  Rhiannan Kievit E  10:18 AM [facility]

## 2022-06-10 ENCOUNTER — Encounter: Payer: Self-pay | Admitting: Nurse Practitioner

## 2022-06-10 ENCOUNTER — Ambulatory Visit (INDEPENDENT_AMBULATORY_CARE_PROVIDER_SITE_OTHER): Payer: 59 | Admitting: Nurse Practitioner

## 2022-06-10 VITALS — BP 100/68 | HR 83 | Temp 97.7°F | Ht 68.25 in | Wt 179.0 lb

## 2022-06-10 DIAGNOSIS — Z0001 Encounter for general adult medical examination with abnormal findings: Secondary | ICD-10-CM

## 2022-06-10 DIAGNOSIS — R7309 Other abnormal glucose: Secondary | ICD-10-CM

## 2022-06-10 DIAGNOSIS — Z130 Encounter for screening for diseases of the blood and blood-forming organs and certain disorders involving the immune mechanism: Secondary | ICD-10-CM

## 2022-06-10 DIAGNOSIS — G8929 Other chronic pain: Secondary | ICD-10-CM

## 2022-06-10 DIAGNOSIS — Z0000 Encounter for general adult medical examination without abnormal findings: Secondary | ICD-10-CM

## 2022-06-10 DIAGNOSIS — E559 Vitamin D deficiency, unspecified: Secondary | ICD-10-CM

## 2022-06-10 DIAGNOSIS — Z1329 Encounter for screening for other suspected endocrine disorder: Secondary | ICD-10-CM

## 2022-06-10 DIAGNOSIS — Z79899 Other long term (current) drug therapy: Secondary | ICD-10-CM

## 2022-06-10 DIAGNOSIS — E782 Mixed hyperlipidemia: Secondary | ICD-10-CM

## 2022-06-10 DIAGNOSIS — Z1389 Encounter for screening for other disorder: Secondary | ICD-10-CM

## 2022-06-10 NOTE — Patient Instructions (Signed)

## 2022-06-11 LAB — URINALYSIS, ROUTINE W REFLEX MICROSCOPIC
Bilirubin Urine: NEGATIVE
Glucose, UA: NEGATIVE
Hgb urine dipstick: NEGATIVE
Ketones, ur: NEGATIVE
Leukocytes,Ua: NEGATIVE
Nitrite: NEGATIVE
Protein, ur: NEGATIVE
Specific Gravity, Urine: 1.024 (ref 1.001–1.035)
pH: 5.5 (ref 5.0–8.0)

## 2022-06-11 LAB — COMPLETE METABOLIC PANEL WITH GFR
AG Ratio: 1.3 (calc) (ref 1.0–2.5)
ALT: 18 U/L (ref 6–29)
AST: 19 U/L (ref 10–30)
Albumin: 4.6 g/dL (ref 3.6–5.1)
Alkaline phosphatase (APISO): 65 U/L (ref 31–125)
BUN: 19 mg/dL (ref 7–25)
CO2: 28 mmol/L (ref 20–32)
Calcium: 9.7 mg/dL (ref 8.6–10.2)
Chloride: 103 mmol/L (ref 98–110)
Creat: 0.88 mg/dL (ref 0.50–0.97)
Globulin: 3.5 g/dL (calc) (ref 1.9–3.7)
Glucose, Bld: 85 mg/dL (ref 65–99)
Potassium: 4.3 mmol/L (ref 3.5–5.3)
Sodium: 139 mmol/L (ref 135–146)
Total Bilirubin: 1 mg/dL (ref 0.2–1.2)
Total Protein: 8.1 g/dL (ref 6.1–8.1)
eGFR: 88 mL/min/{1.73_m2} (ref >=60)

## 2022-06-11 LAB — CBC WITH DIFFERENTIAL/PLATELET
Absolute Monocytes: 590 cells/uL (ref 200–950)
Basophils Absolute: 20 cells/uL (ref 0–200)
Basophils Relative: 0.3 %
Eosinophils Absolute: 328 cells/uL (ref 15–500)
Eosinophils Relative: 4.9 %
HCT: 41.9 % (ref 35.0–45.0)
Hemoglobin: 14.3 g/dL (ref 11.7–15.5)
Lymphs Abs: 1474 cells/uL (ref 850–3900)
MCH: 32.6 pg (ref 27.0–33.0)
MCHC: 34.1 g/dL (ref 32.0–36.0)
MCV: 95.4 fL (ref 80.0–100.0)
MPV: 11.6 fL (ref 7.5–12.5)
Monocytes Relative: 8.8 %
Neutro Abs: 4288 cells/uL (ref 1500–7800)
Neutrophils Relative %: 64 %
Platelets: 186 10*3/uL (ref 140–400)
RBC: 4.39 10*6/uL (ref 3.80–5.10)
RDW: 11.8 % (ref 11.0–15.0)
Total Lymphocyte: 22 %
WBC: 6.7 10*3/uL (ref 3.8–10.8)

## 2022-06-11 LAB — HEMOGLOBIN A1C
Hgb A1c MFr Bld: 4.6 % of total Hgb (ref <5.7)
Mean Plasma Glucose: 85 mg/dL
eAG (mmol/L): 4.7 mmol/L

## 2022-06-11 LAB — LIPID PANEL
Cholesterol: 192 mg/dL (ref <200)
HDL: 67 mg/dL (ref >=50)
LDL Cholesterol (Calc): 102 mg/dL (calc) — ABNORMAL HIGH
Non-HDL Cholesterol (Calc): 125 mg/dL (calc) (ref <130)
Total CHOL/HDL Ratio: 2.9 (calc) (ref <5.0)
Triglycerides: 126 mg/dL (ref <150)

## 2022-06-11 LAB — TSH: TSH: 1.23 mIU/L

## 2022-06-11 LAB — MAGNESIUM: Magnesium: 2.1 mg/dL (ref 1.5–2.5)

## 2022-06-11 LAB — VITAMIN D 25 HYDROXY (VIT D DEFICIENCY, FRACTURES): Vit D, 25-Hydroxy: 34 ng/mL (ref 30–100)

## 2022-08-29 IMAGING — DX DG CHEST 1V PORT
1 series · 1 of 1 positions shown · non-contrast
Comparison: Chest CT February 16, 2006

CLINICAL DATA: Shortness of breath

EXAM:
PORTABLE CHEST 1 VIEW

[chest]
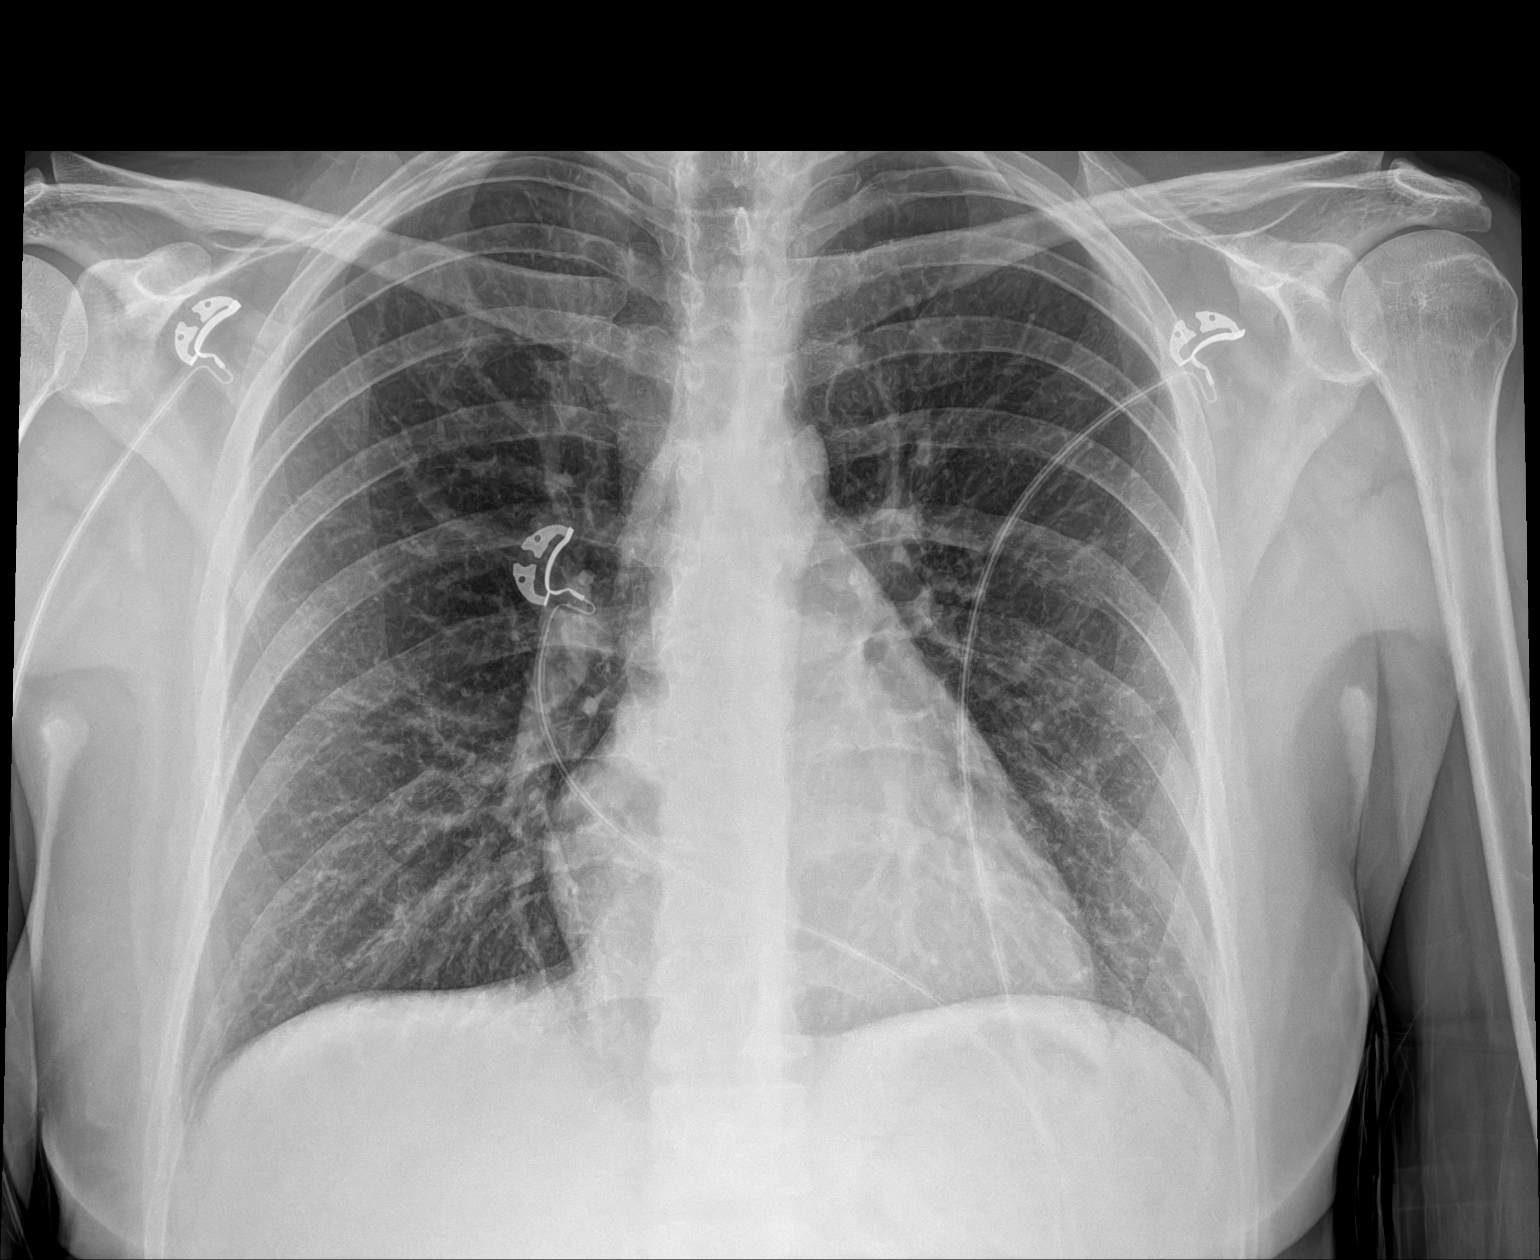

[1 of 1 positions shown; findings below may reference images not displayed]

FINDINGS: Lungs are clear. Heart size and pulmonary vascularity are normal. No
adenopathy. Pneumothorax. No bone lesions.
IMPRESSION: Lungs clear.  Cardiac silhouette normal.

## 2022-08-29 IMAGING — CT CT ABD-PELV W/ CM
2 of 4 series · 16 of 46 positions shown, 18 images · IV contrast (APPLIED)
Comparison: None.

CLINICAL DATA: Gastric pain with nausea and vomiting

EXAM:
CT ABDOMEN AND PELVIS WITH CONTRAST
TECHNIQUE: Multidetector CT imaging of the abdomen and pelvis was performed
using the standard protocol following bolus administration of
intravenous contrast.
CONTRAST:  100mL OMNIPAQUE IOHEXOL 300 MG/ML  SOLN

[Series 2: abd pel w · axial · 0.75mm/px · z∈[+698,+1133]mm · 13 of 95 slices shown, 15 images]
[im 4/95  soft-tissue]
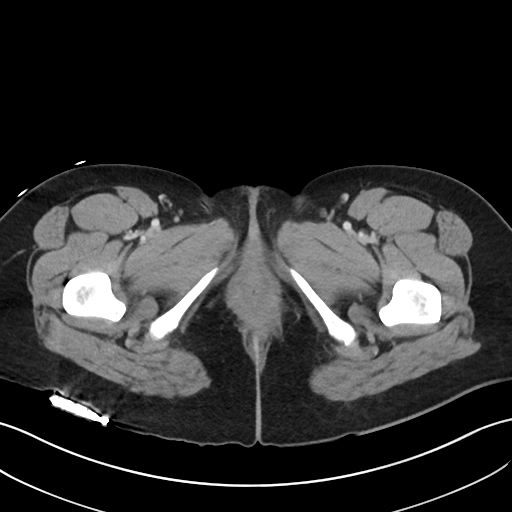
[im 4/95  bone]
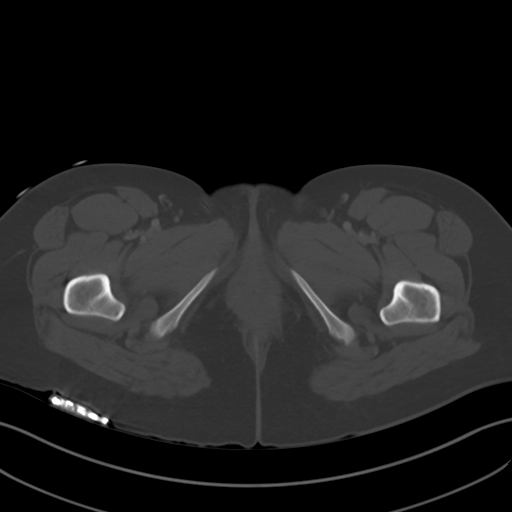
[im 12/95  soft-tissue]
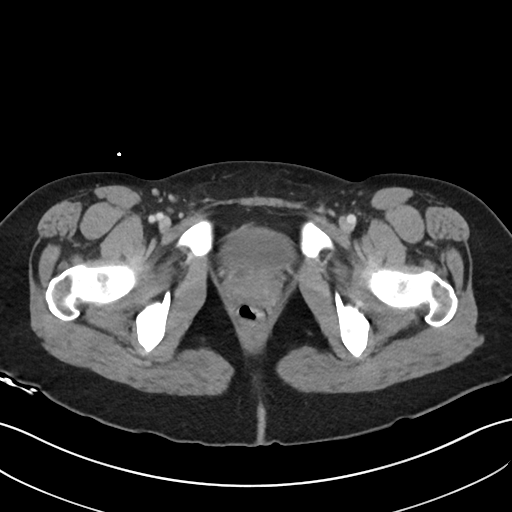
[im 19/95  soft-tissue]
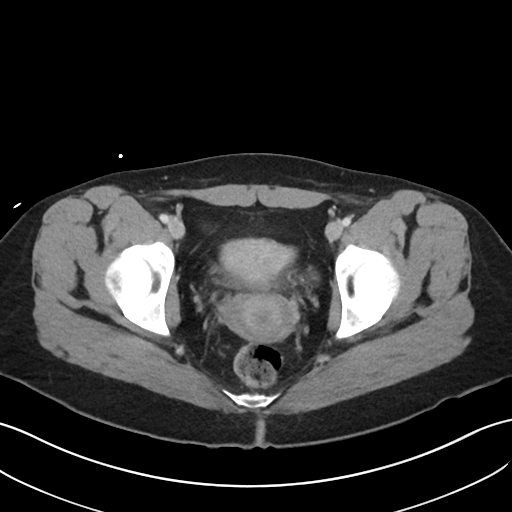
[im 27/95  soft-tissue]
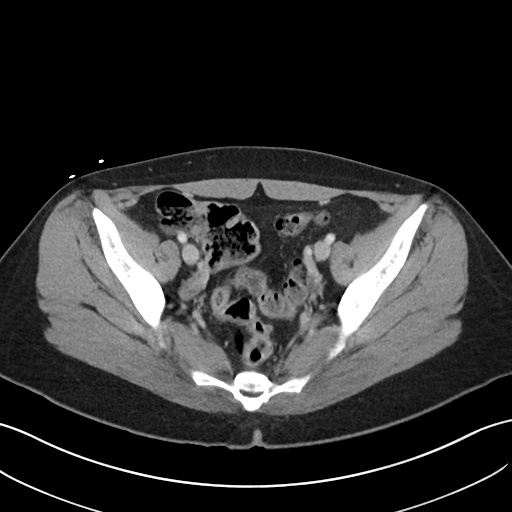
[im 34/95  soft-tissue]
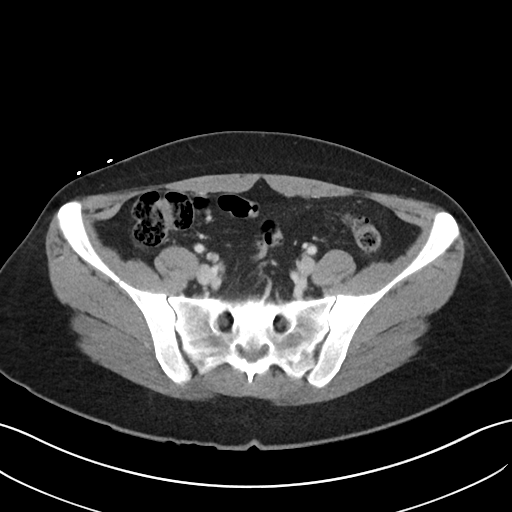
[im 42/95  soft-tissue]
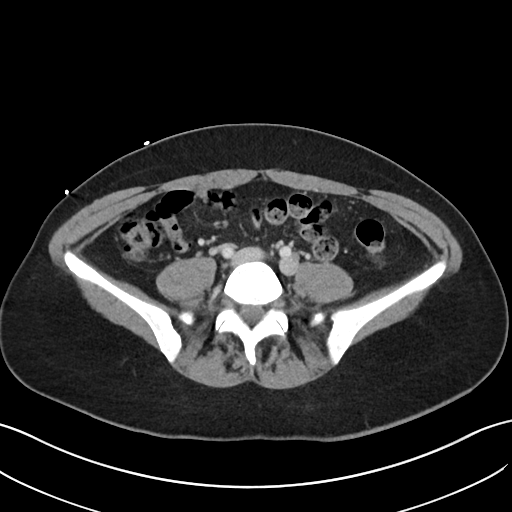
[im 49/95  soft-tissue]
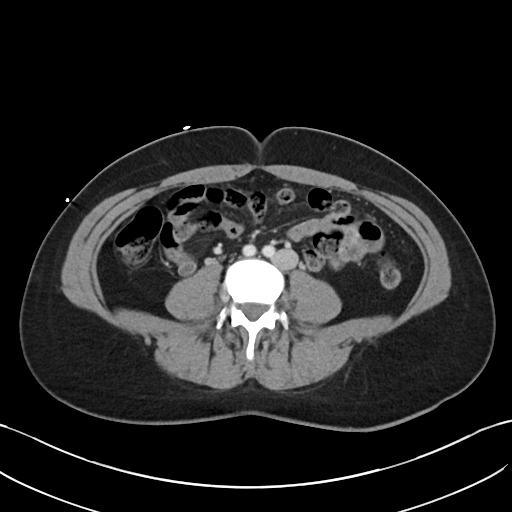
[im 53/95  soft-tissue]
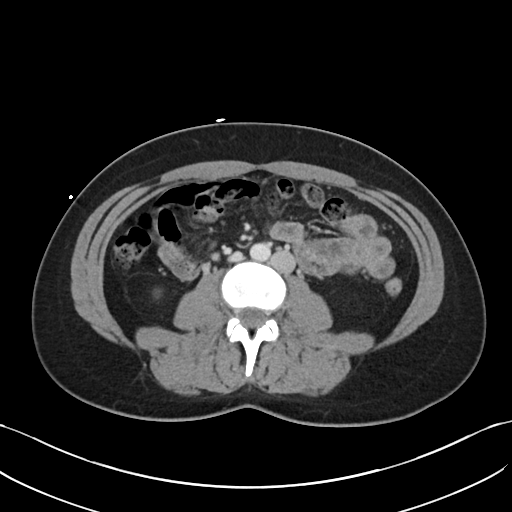
[im 61/95  soft-tissue]
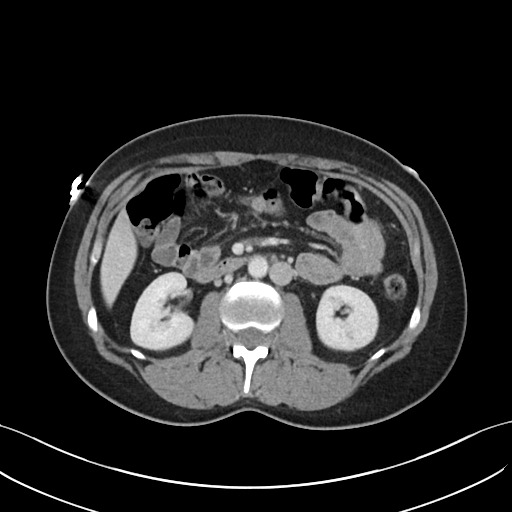
[im 61/95  bone]
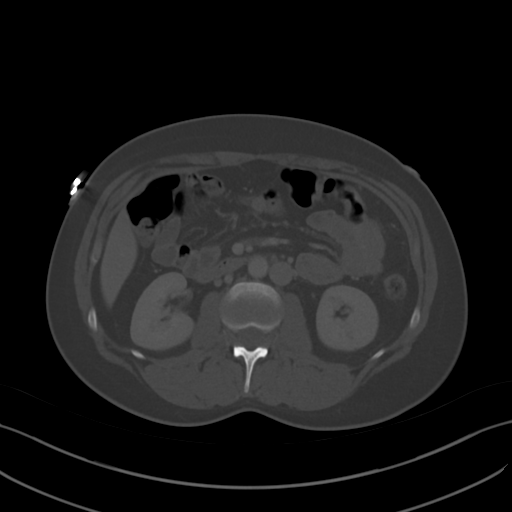
[im 68/95  soft-tissue]
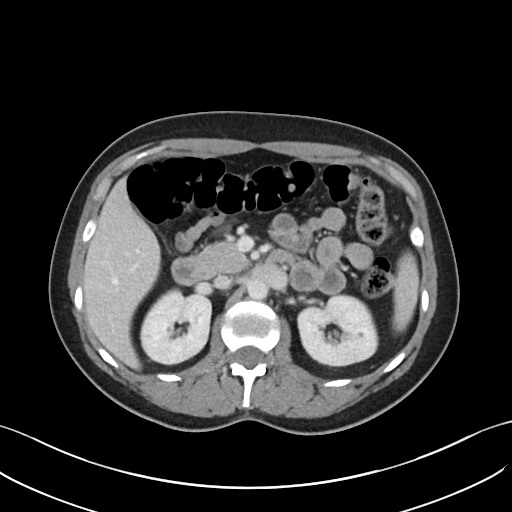
[im 76/95  soft-tissue]
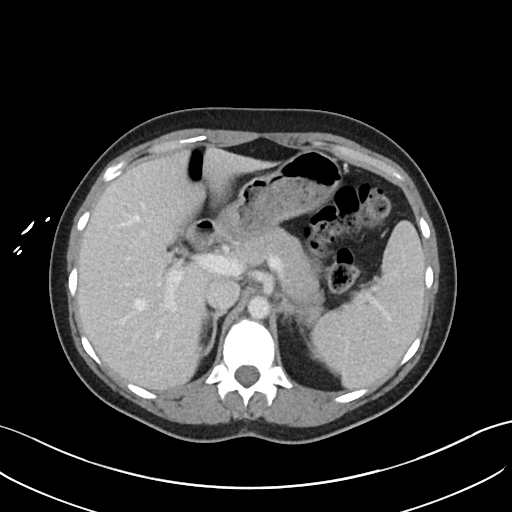
[im 83/95  soft-tissue]
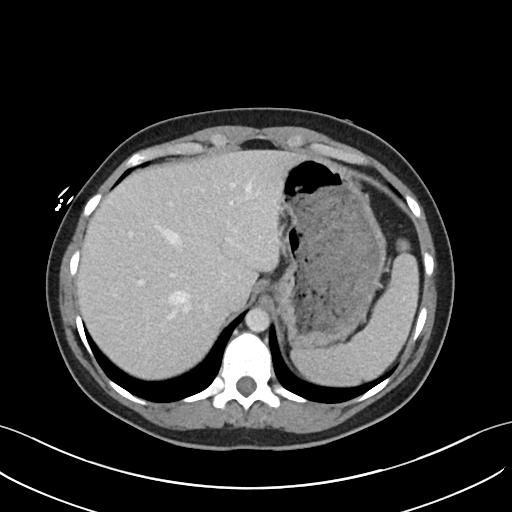
[im 91/95  soft-tissue]
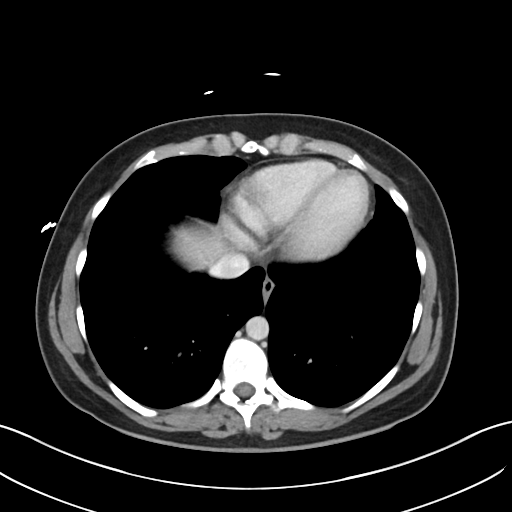

[Series 5: coronal · coronal · 0.79mm/px · 3 of 86 slices shown]
[im 29/86  soft-tissue]
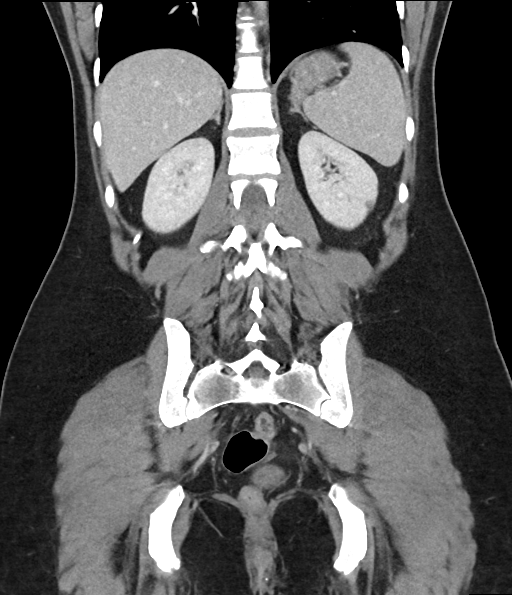
[im 38/86  soft-tissue]
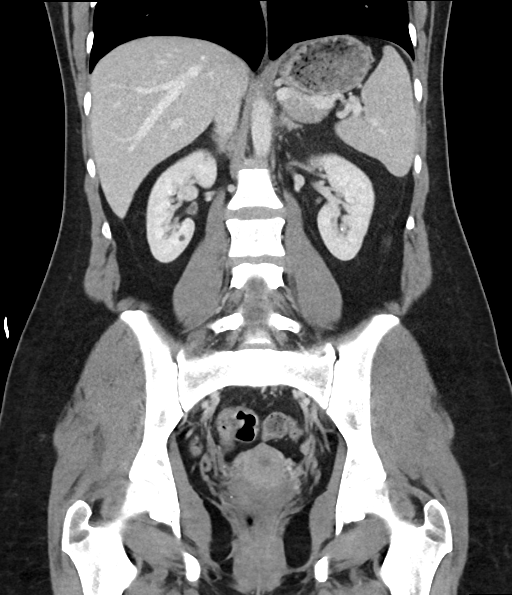
[im 48/86  soft-tissue]
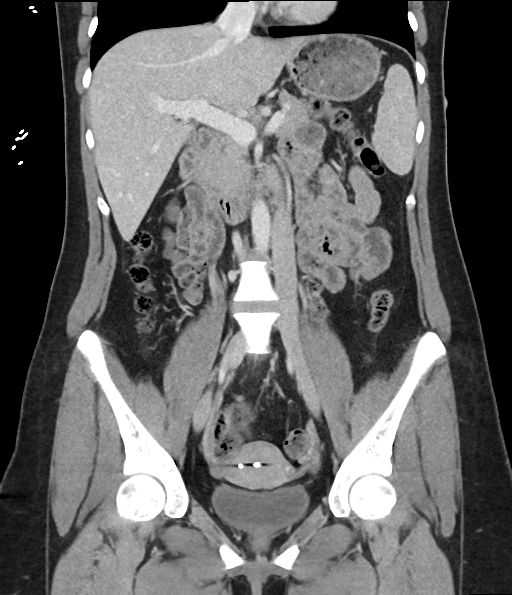

[16 of 46 positions shown; findings below may reference images not displayed]

FINDINGS: Lower chest: Lung bases are clear.

Hepatobiliary: No focal liver lesions are appreciable. Gallbladder
wall is not appreciably thickened. There is no biliary duct
dilatation.

Pancreas: There is no pancreatic mass or inflammatory focus.

Spleen: No splenic lesions are evident.

Adrenals/Urinary Tract: Adrenals bilaterally appear normal. There is
a cyst along the lateral lower pole left kidney measuring 5 x 5 mm.
There is a 3 mm cyst in the mid left kidney laterally. There is no
evident hydronephrosis on either side. There is no appreciable renal
or ureteral calculus on either side. Urinary bladder is midline with
wall thickness within normal limits.

Stomach/Bowel: There is no appreciable bowel wall or mesenteric
thickening. No evident bowel obstruction. Terminal ileum appears
normal. Appendix appears normal. Note that the appendix is in the
midline of the upper pelvis. No free air or portal venous air.

Vascular/Lymphatic: No abdominal aortic aneurysm. No arterial
vascular lesions are evident. Major venous structures appear patent.
There is no evident adenopathy in the abdomen or pelvis.

Reproductive: The uterus is anteverted. There is an intrauterine
device positioned within the endometrium. There is a collapsed cyst
within the left ovary measuring 1.5 x 1.0 cm. No other adnexal mass.
No free free pelvic fluid.

Other: No evident abscess or ascites in the abdomen or pelvis.

Musculoskeletal: No blastic or lytic bone lesions. No abdominal wall
or intramuscular lesions are evident.
IMPRESSION: 1. Small collapsed cyst in left ovary without surrounding fluid. No
other adnexal mass.

2.  Intrauterine device within endometrium.

3. No bowel wall thickening or bowel obstruction. No abscess in the
abdomen or pelvis. Appendix appears normal.

4. No renal or ureteral calculus. No hydronephrosis. Urinary bladder
wall thickness normal.

## 2022-10-26 IMAGING — US US ABDOMEN LIMITED
1 series · 14 of 25 positions shown · non-contrast
Comparison: CT abdomen and pelvis dated September 12, 2020

CLINICAL DATA: Right upper quadrant pain

EXAM:
ULTRASOUND ABDOMEN LIMITED RIGHT UPPER QUADRANT

[Series 1: us abdomen limited ruq (liver/gb) · 14 of 38 slices shown]
[im 1/38]
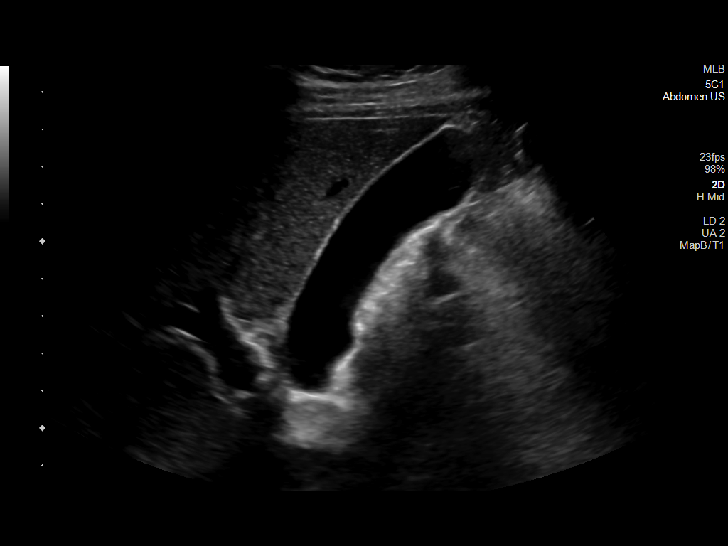
[im 4/38]
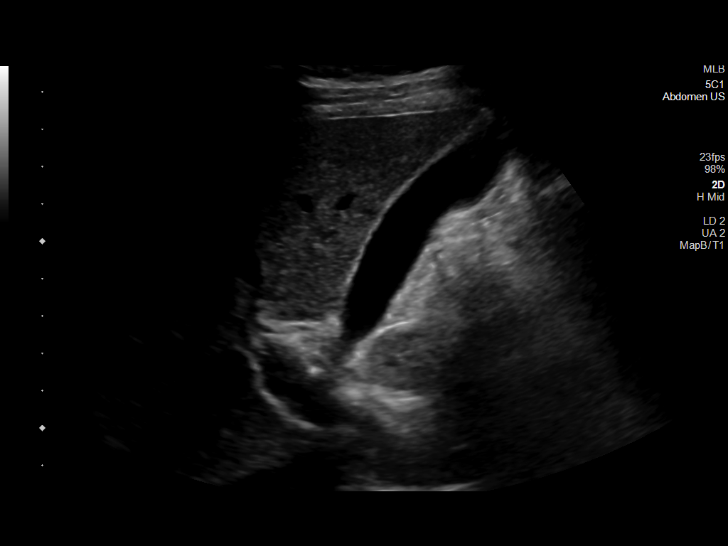
[im 7/38]
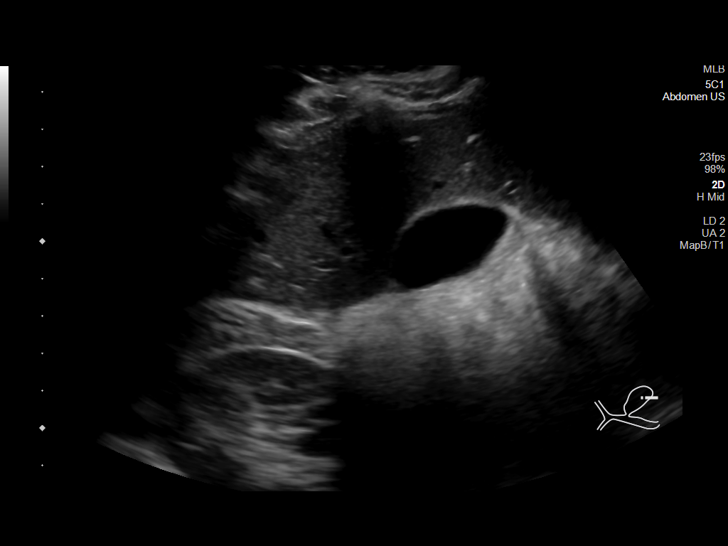
[im 10/38]
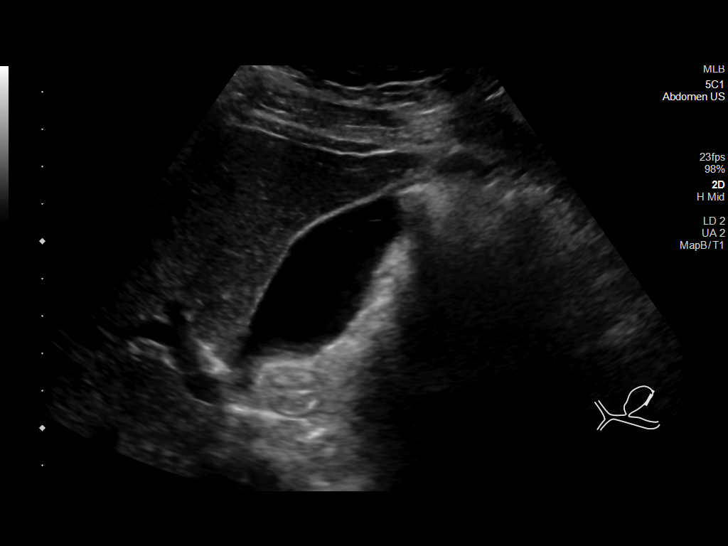
[im 13/38]
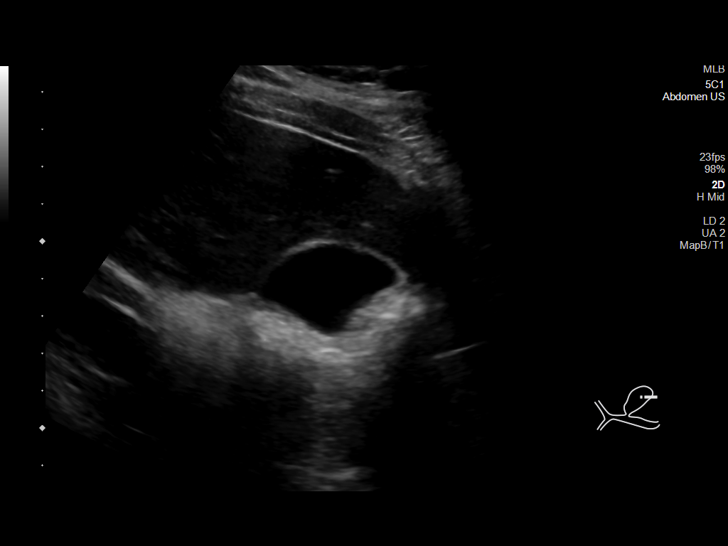
[im 14/38]
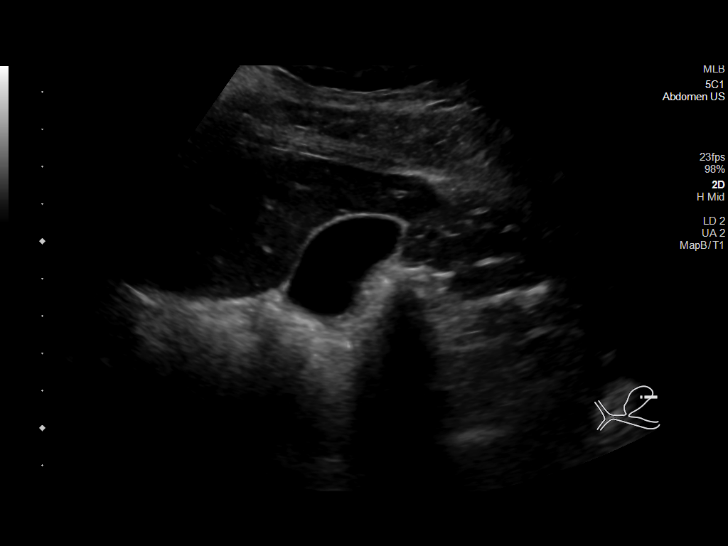
[im 17/38]
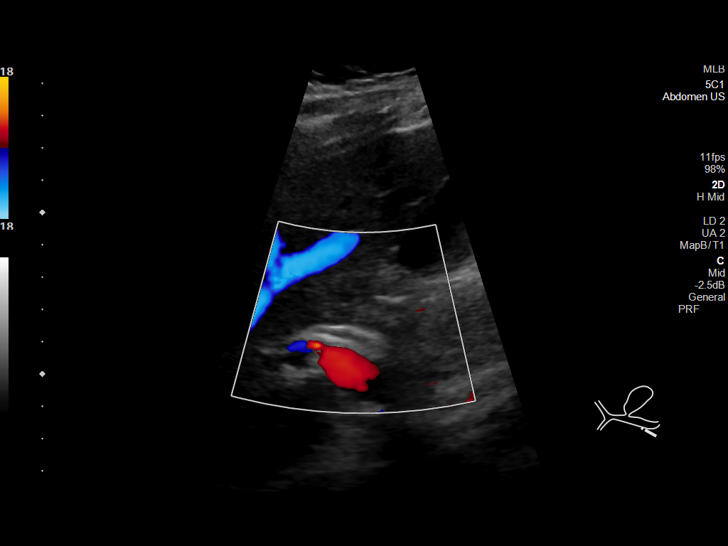
[im 21/38]
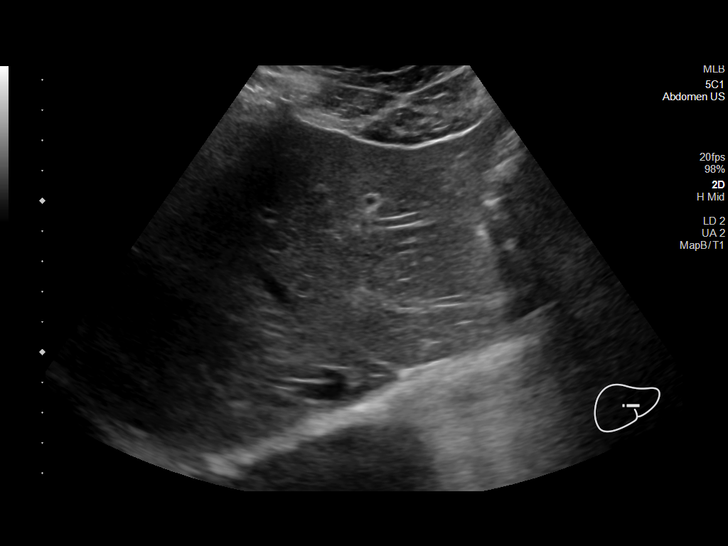
[im 24/38]
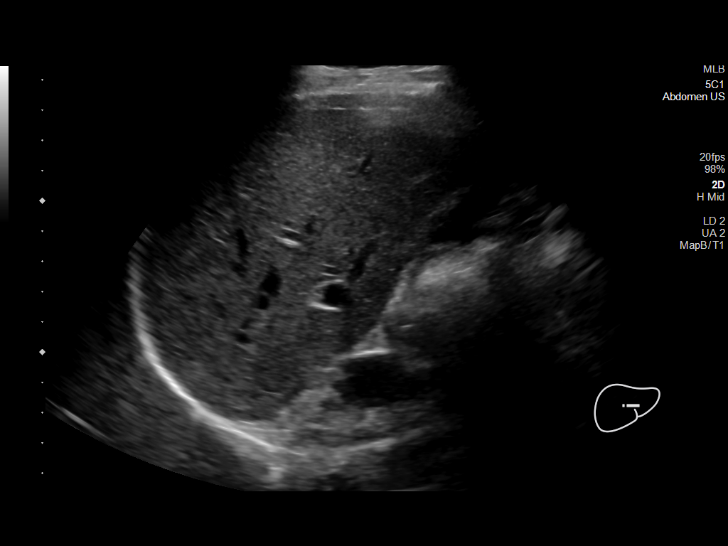
[im 25/38]
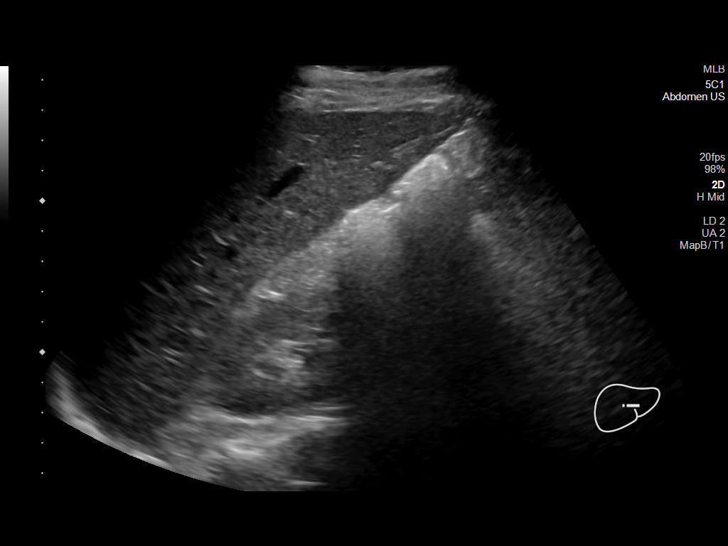
[im 28/38]
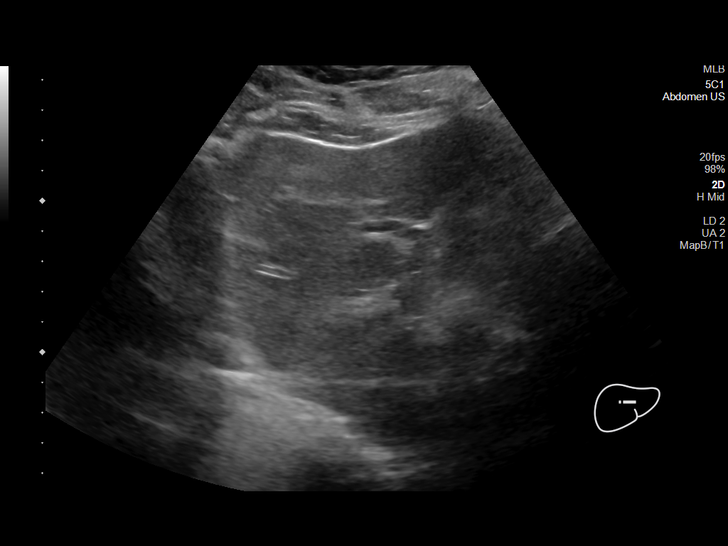
[im 31/38]
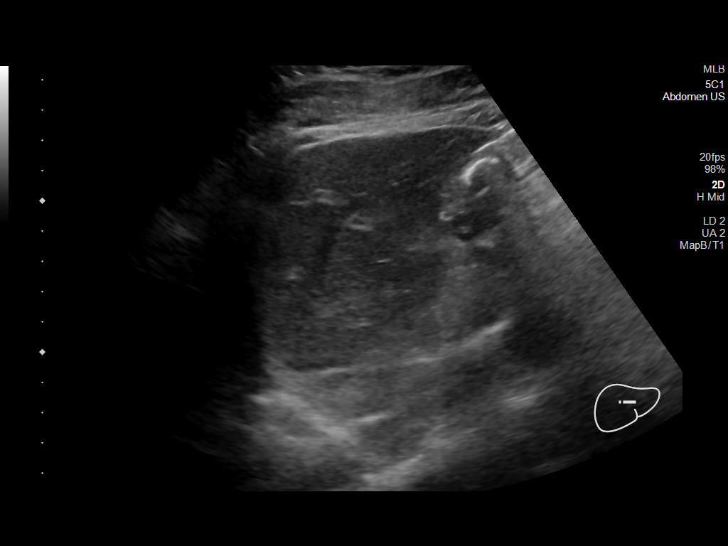
[im 34/38]
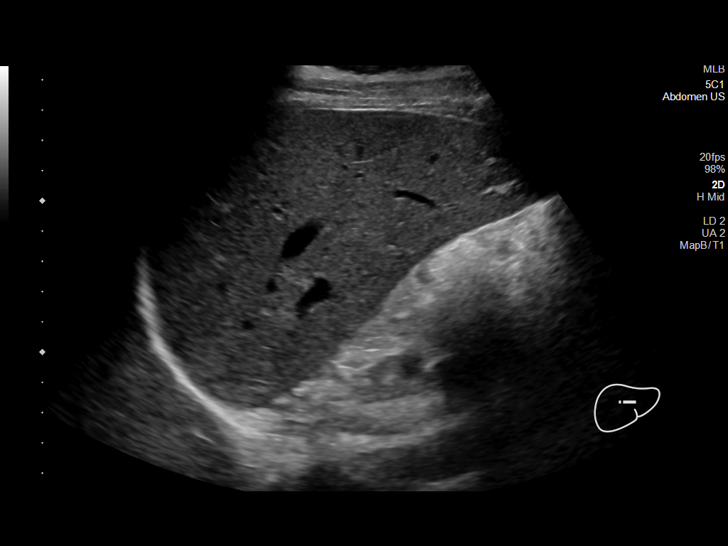
[im 38/38]
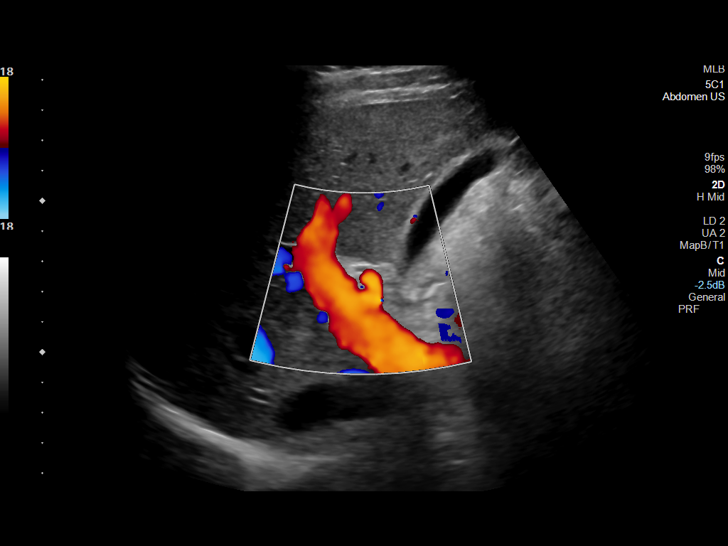

[14 of 25 positions shown; findings below may reference images not displayed]

FINDINGS: Gallbladder:

No gallstones or wall thickening visualized. No sonographic Murphy
sign noted by sonographer.

Common bile duct:

Diameter: 3 mm

Liver:

No focal lesion identified. Within normal limits in parenchymal
echogenicity. Portal vein is patent on color Doppler imaging with
normal direction of blood flow towards the liver.

Other: None.
IMPRESSION: No sonographic evidence of acute cholecystitis.

## 2023-01-04 ENCOUNTER — Encounter: Payer: Self-pay | Admitting: Nurse Practitioner

## 2023-05-05 DIAGNOSIS — Z6820 Body mass index (BMI) 20.0-20.9, adult: Secondary | ICD-10-CM | POA: Diagnosis not present

## 2023-05-05 DIAGNOSIS — Z124 Encounter for screening for malignant neoplasm of cervix: Secondary | ICD-10-CM | POA: Diagnosis not present

## 2023-05-05 DIAGNOSIS — Z01419 Encounter for gynecological examination (general) (routine) without abnormal findings: Secondary | ICD-10-CM | POA: Diagnosis not present

## 2023-06-02 DIAGNOSIS — Z1239 Encounter for other screening for malignant neoplasm of breast: Secondary | ICD-10-CM | POA: Diagnosis not present

## 2023-06-08 ENCOUNTER — Other Ambulatory Visit: Payer: Self-pay | Admitting: Obstetrics and Gynecology

## 2023-06-08 DIAGNOSIS — R928 Other abnormal and inconclusive findings on diagnostic imaging of breast: Secondary | ICD-10-CM

## 2023-06-10 ENCOUNTER — Encounter: Payer: 59 | Admitting: Nurse Practitioner

## 2023-06-11 ENCOUNTER — Ambulatory Visit: Payer: BC Managed Care – PPO | Admitting: Nurse Practitioner

## 2023-06-11 ENCOUNTER — Encounter: Payer: Self-pay | Admitting: Nurse Practitioner

## 2023-06-11 VITALS — BP 104/72 | HR 59 | Temp 98.0°F | Ht 68.25 in | Wt 190.5 lb

## 2023-06-11 DIAGNOSIS — Z0000 Encounter for general adult medical examination without abnormal findings: Secondary | ICD-10-CM

## 2023-06-11 DIAGNOSIS — Z131 Encounter for screening for diabetes mellitus: Secondary | ICD-10-CM

## 2023-06-11 DIAGNOSIS — Z136 Encounter for screening for cardiovascular disorders: Secondary | ICD-10-CM

## 2023-06-11 DIAGNOSIS — Z13220 Encounter for screening for lipoid disorders: Secondary | ICD-10-CM | POA: Diagnosis not present

## 2023-06-11 DIAGNOSIS — Z0001 Encounter for general adult medical examination with abnormal findings: Secondary | ICD-10-CM | POA: Insufficient documentation

## 2023-06-11 DIAGNOSIS — L723 Sebaceous cyst: Secondary | ICD-10-CM

## 2023-06-11 DIAGNOSIS — E663 Overweight: Secondary | ICD-10-CM

## 2023-06-11 LAB — COMPREHENSIVE METABOLIC PANEL WITH GFR
ALT: 16 U/L (ref 0–35)
AST: 23 U/L (ref 0–37)
Albumin: 4.6 g/dL (ref 3.5–5.2)
Alkaline Phosphatase: 63 U/L (ref 39–117)
BUN: 16 mg/dL (ref 6–23)
CO2: 27 meq/L (ref 19–32)
Calcium: 9.3 mg/dL (ref 8.4–10.5)
Chloride: 104 meq/L (ref 96–112)
Creatinine, Ser: 1.04 mg/dL (ref 0.40–1.20)
GFR: 69.05 mL/min (ref >60.00)
Glucose, Bld: 81 mg/dL (ref 70–99)
Potassium: 4.2 meq/L (ref 3.5–5.1)
Sodium: 137 meq/L (ref 135–145)
Total Bilirubin: 0.7 mg/dL (ref 0.2–1.2)
Total Protein: 7.9 g/dL (ref 6.0–8.3)

## 2023-06-11 LAB — CBC
HCT: 40.5 % (ref 36.0–46.0)
Hemoglobin: 13.9 g/dL (ref 12.0–15.0)
MCHC: 34.4 g/dL (ref 30.0–36.0)
MCV: 94.4 fl (ref 78.0–100.0)
Platelets: 206 10*3/uL (ref 150.0–400.0)
RBC: 4.29 Mil/uL (ref 3.87–5.11)
RDW: 13 % (ref 11.5–15.5)
WBC: 6.4 10*3/uL (ref 4.0–10.5)

## 2023-06-11 LAB — LIPID PANEL
Cholesterol: 164 mg/dL (ref 0–200)
HDL: 44.3 mg/dL (ref >39.00)
LDL Cholesterol: 102 mg/dL — ABNORMAL HIGH (ref 0–99)
NonHDL: 119.67
Total CHOL/HDL Ratio: 4
Triglycerides: 90 mg/dL (ref 0.0–149.0)
VLDL: 18 mg/dL (ref 0.0–40.0)

## 2023-06-11 LAB — TSH: TSH: 1.01 u[IU]/mL (ref 0.35–5.50)

## 2023-06-11 LAB — HEMOGLOBIN A1C: Hgb A1c MFr Bld: 4.4 % — ABNORMAL LOW (ref 4.6–6.5)

## 2023-06-11 NOTE — Assessment & Plan Note (Signed)
 Preventative health care and preventative health maintenance discussed.  Handout provided.  Patient declined hep C screening and Tdap Labs ordered, further recommendations may be made based upon these results.

## 2023-06-11 NOTE — Assessment & Plan Note (Signed)
 Labs ordered, further recommendations may be made based upon his results.

## 2023-06-11 NOTE — Assessment & Plan Note (Signed)
 Labs ordered, further recommendations may be made based upon these results

## 2023-06-11 NOTE — Progress Notes (Signed)
 Complete physical exam  Patient: [name] [name]   DOB: 1985-10-19   38 y.o. Female  MRN: [id number]  Subjective:    Chief Complaint  Patient presents with   Annual Exam    [name] [name] is a 38 y.o. female who presents today for a complete physical exam.  Patient arrives today to establish care as a new patient to this office.  She would like to get her annual exam completed today as well.  She was seeing Dr. Oneta Rack.  She reports that she has a mass on her scalp that she just wants to make me aware of.  Does not bother her.  She does have a history of sebaceous cyst on her scalp which did abscess in the past.  Ultimately it resolved.  She also reports that her sister was recently diagnosed with breast cancer, thus patient has had her first mammogram about 1 week ago.  It was recommended that she have diagnostic mammogram of left breast, so she is presenting to the breast center in about 1 week to have this completed as well.  Most recent fall risk assessment:    06/11/2023    1:18 PM  Fall Risk   Falls in the past year? 0  Number falls in past yr: 0  Injury with Fall? 0  Risk for fall due to : No Fall Risks  Follow up Falls evaluation completed     Most recent depression screenings:    06/11/2023    1:18 PM 05/01/2020   10:55 AM  PHQ 2/9 Scores  PHQ - 2 Score 0 0      No past medical history on file. Past Surgical History:  Procedure Laterality Date   CERVICAL BIOPSY  W/ LOOP ELECTRODE EXCISION  2012   HERNIA REPAIR  1996   Social History   Socioeconomic History   Marital status: Married    Spouse name: Ray Damico   Number of children: Not on file   Years of education: Not on file   Highest education level: Bachelor's degree (e.g., BA, AB, BS)  Occupational History   Occupation: travel agent  Tobacco Use   Smoking status: Never   Smokeless tobacco: Never  Vaping Use   Vaping status: Never Used  Substance and Sexual Activity   Alcohol use: Yes     Comment: rare   Drug use: No   Sexual activity: Yes    Birth control/protection: I.U.D.  Other Topics Concern   Not on file  Social History Narrative   Not on file   Social Drivers of Health   Financial Resource Strain: Low Risk  (06/07/2023)   Overall Financial Resource Strain (CARDIA)    Difficulty of Paying Living Expenses: Not hard at all  Food Insecurity: No Food Insecurity (06/07/2023)   Hunger Vital Sign    Worried About Running Out of Food in the Last Year: Never true    Ran Out of Food in the Last Year: Never true  Transportation Needs: No Transportation Needs (06/07/2023)   PRAPARE - Administrator, Civil Service (Medical): No    Lack of Transportation (Non-Medical): No  Physical Activity: Insufficiently Active (06/07/2023)   Exercise Vital Sign    Days of Exercise per Week: 3 days    Minutes of Exercise per Session: 30 min  Stress: No Stress Concern Present (06/07/2023)   [employer] of Occupational Health - Occupational Stress Questionnaire    Feeling of Stress : Only a little  Social Connections: Moderately Integrated (06/07/2023)   Social Connection and Isolation Panel [NHANES]    Frequency of Communication with Friends and Family: More than three times a week    Frequency of Social Gatherings with Friends and Family: Twice a week    Attends Religious Services: 1 to 4 times per year    Active Member of [employer] or Organizations: No    Attends Engineer, structural: Not on file    Marital Status: Married  Catering manager Violence: Not on file   Family History  Problem Relation Age of Onset   Hypertension Mother    Migraines Mother    Kidney Stones Mother    Kidney Stones Sister    Asthma Sister    No Known Allergies    Patient Care Team: Elenore Paddy, NP as PCP - General (Nurse Practitioner)   Outpatient Medications Prior to Visit  Medication Sig   levonorgestrel (KYLEENA) 19.5 MG IUD Kyleena 17.5 mcg/24 hrs (69yrs) 19.5mg  intrauterine  device  Take 1 device by intrauterine route.   Multiple Vitamins-Minerals (MULTIVITAMIN GUMMIES ADULT PO) Take 2 tablets by mouth daily.   [DISCONTINUED] Cholecalciferol 1.25 MG (50000 UT) capsule Take one tablet by mouth three days a week for twelve weeks. (Patient not taking: Reported on 06/10/2022)   No facility-administered medications prior to visit.    Review of Systems  Constitutional:  Negative for chills, fever and malaise/fatigue.  Eyes:  Negative for blurred vision and double vision.  Respiratory:  Negative for shortness of breath and wheezing.   Cardiovascular:  Negative for chest pain and palpitations.  Gastrointestinal:  Negative for abdominal pain and blood in stool.  Genitourinary:  Negative for dysuria and hematuria.  Skin:  Negative for itching and rash.  Neurological:  Negative for seizures and loss of consciousness.  Psychiatric/Behavioral:  Negative for depression. The patient is not nervous/anxious.           Objective:     BP 104/72   Pulse (!) 59   Temp 98 F (36.7 C) (Temporal)   Ht 5' 8.25" (1.734 m)   Wt 190 lb 8 oz (86.4 kg)   LMP 05/16/2023   SpO2 97%   BMI 28.75 kg/m    Physical Exam Vitals reviewed.  Constitutional:      Appearance: Normal appearance.  HENT:     Head: Normocephalic and atraumatic.      Right Ear: Tympanic membrane, ear canal and external ear normal.     Left Ear: Tympanic membrane, ear canal and external ear normal.  Eyes:     General:        Right eye: No discharge.        Left eye: No discharge.     Extraocular Movements: Extraocular movements intact.     Conjunctiva/sclera: Conjunctivae normal.     Pupils: Pupils are equal, round, and reactive to light.  Neck:     Vascular: No carotid bruit.  Cardiovascular:     Rate and Rhythm: Normal rate and regular rhythm.     Pulses: Normal pulses.     Heart sounds: Normal heart sounds. No murmur heard. Pulmonary:     Effort: Pulmonary effort is normal.     Breath  sounds: Normal breath sounds.  Chest:     Comments: Chest exam deferred per patient preference Abdominal:     General: Abdomen is flat. Bowel sounds are normal. There is no distension.     Palpations: Abdomen is soft. There is no mass.  Tenderness: There is no abdominal tenderness.  Musculoskeletal:        General: No tenderness.     Cervical back: Neck supple. No muscular tenderness.     Right lower leg: No edema.     Left lower leg: No edema.  Lymphadenopathy:     Cervical: No cervical adenopathy.     Upper Body:     Right upper body: No supraclavicular adenopathy.     Left upper body: No supraclavicular adenopathy.  Skin:    General: Skin is warm and dry.  Neurological:     General: No focal deficit present.     Mental Status: She is alert and oriented to person, place, and time.     Motor: No weakness.     Gait: Gait normal.  Psychiatric:        Mood and Affect: Mood normal.        Behavior: Behavior normal.        Judgment: Judgment normal.         06/11/2023    1:18 PM 05/01/2020   10:55 AM  PHQ9 SCORE ONLY  PHQ-9 Total Score 0 0     No results found for any visits on 06/11/23.     Assessment & Plan:    Routine Health Maintenance and Physical Exam  Immunization History  Administered Date(s) Administered   Unspecified SARS-COV-2 Vaccination 05/05/2019, 06/02/2019    Health Maintenance  Topic Date Due   HIV Screening  Never done   Cervical Cancer Screening (HPV/Pap Cotest)  10/21/2017   COVID-19 Vaccine (3 - Mixed Product risk series) 06/30/2019   DTaP/Tdap/Td (1 - Tdap) 06/10/2024 (Originally 09/27/2005)   Hepatitis C Screening  06/10/2024 (Originally 09/27/2004)   INFLUENZA VACCINE  10/01/2023   HPV VACCINES  Aged Out   Meningococcal B Vaccine  Aged Out    Discussed health benefits of physical activity, and encouraged her to engage in regular exercise appropriate for her age and condition.  Problem List Items Addressed This Visit        Musculoskeletal and Integument   Sebaceous cyst   Chronic Mass to scalp seems consistent with sebaceous cyst.  Per shared decision making patient will practice watchful waiting and let me know if mass continues to grow or becomes bothersome in someway.  I did offer a referral to surgery to consult regarding removal as well as offered to image the area for further evaluation but she has declined.  I am agreeable to this because on exam it does appear consistent with sebaceous cyst.        Other   Encounter for general adult medical examination with abnormal findings - Primary   Preventative health care and preventative health maintenance discussed.  Handout provided.  Patient declined hep C screening and Tdap Labs ordered, further recommendations may be made based upon these results.       Relevant Orders   CBC   Comprehensive metabolic panel with GFR   Hemoglobin A1c   Lipid panel   TSH   Diabetes mellitus screening   Labs ordered, further recommendations may be made based upon these results       Relevant Orders   CBC   Comprehensive metabolic panel with GFR   Hemoglobin A1c   Lipid panel   TSH   Encounter for lipid screening for cardiovascular disease   Labs ordered, further recommendations may be made based upon his results       Relevant Orders   CBC  Comprehensive metabolic panel with GFR   Hemoglobin A1c   Lipid panel   TSH   Overweight   Labs ordered, further recommendations may be made based upon these results       Relevant Orders   CBC   Comprehensive metabolic panel with GFR   Hemoglobin A1c   Lipid panel   TSH   Return in about 1 year (around 06/10/2024) for CPE with Neytiri Asche.     Elenore Paddy, NP

## 2023-06-11 NOTE — Assessment & Plan Note (Signed)
 Chronic Mass to scalp seems consistent with sebaceous cyst.  Per shared decision making patient will practice watchful waiting and let me know if mass continues to grow or becomes bothersome in someway.  I did offer a referral to surgery to consult regarding removal as well as offered to image the area for further evaluation but she has declined.  I am agreeable to this because on exam it does appear consistent with sebaceous cyst.

## 2023-06-21 ENCOUNTER — Ambulatory Visit
Admission: RE | Admit: 2023-06-21 | Discharge: 2023-06-21 | Disposition: A | Discharge status: Home / Self Care | Source: Ambulatory Visit | Attending: Obstetrics and Gynecology | Admitting: Obstetrics and Gynecology

## 2023-06-21 DIAGNOSIS — R928 Other abnormal and inconclusive findings on diagnostic imaging of breast: Secondary | ICD-10-CM | POA: Diagnosis not present

## 2023-06-21 DIAGNOSIS — N6321 Unspecified lump in the left breast, upper outer quadrant: Secondary | ICD-10-CM | POA: Diagnosis not present

## 2023-06-21 DIAGNOSIS — Z803 Family history of malignant neoplasm of breast: Secondary | ICD-10-CM | POA: Diagnosis not present

## 2023-06-21 DIAGNOSIS — N6012 Diffuse cystic mastopathy of left breast: Secondary | ICD-10-CM | POA: Diagnosis not present

## 2023-06-22 ENCOUNTER — Ambulatory Visit
Admission: RE | Admit: 2023-06-22 | Discharge: 2023-06-22 | Disposition: A | Discharge status: Home / Self Care | Source: Ambulatory Visit | Attending: Obstetrics and Gynecology | Admitting: Obstetrics and Gynecology

## 2023-06-22 ENCOUNTER — Other Ambulatory Visit: Payer: Self-pay | Admitting: Obstetrics and Gynecology

## 2023-06-22 DIAGNOSIS — N6320 Unspecified lump in the left breast, unspecified quadrant: Secondary | ICD-10-CM

## 2023-06-22 DIAGNOSIS — N6321 Unspecified lump in the left breast, upper outer quadrant: Secondary | ICD-10-CM | POA: Diagnosis not present

## 2023-06-22 DIAGNOSIS — R92322 Mammographic fibroglandular density, left breast: Secondary | ICD-10-CM | POA: Diagnosis not present

## 2023-06-22 DIAGNOSIS — D242 Benign neoplasm of left breast: Secondary | ICD-10-CM | POA: Diagnosis not present

## 2023-06-22 HISTORY — PX: BREAST BIOPSY: SHX20

## 2023-06-23 LAB — SURGICAL PATHOLOGY

## 2023-08-19 ENCOUNTER — Ambulatory Visit (HOSPITAL_COMMUNITY)
Admission: EM | Admit: 2023-08-19 | Discharge: 2023-08-19 | Disposition: A | Discharge status: Home / Self Care | Attending: Family Medicine | Admitting: Family Medicine

## 2023-08-19 ENCOUNTER — Encounter (HOSPITAL_COMMUNITY): Payer: Self-pay

## 2023-08-19 DIAGNOSIS — N390 Urinary tract infection, site not specified: Secondary | ICD-10-CM

## 2023-08-19 DIAGNOSIS — Z3202 Encounter for pregnancy test, result negative: Secondary | ICD-10-CM

## 2023-08-19 LAB — POCT URINALYSIS DIP (MANUAL ENTRY)
Glucose, UA: 250 mg/dL — AB
Nitrite, UA: POSITIVE — AB
Protein Ur, POC: 100 mg/dL — AB
Spec Grav, UA: 1.025 (ref 1.010–1.025)
Urobilinogen, UA: >=8 U/dL — AB
pH, UA: 5 (ref 5.0–8.0)

## 2023-08-19 LAB — POCT URINE PREGNANCY: Preg Test, Ur: NEGATIVE

## 2023-08-19 MED ORDER — SULFAMETHOXAZOLE-TRIMETHOPRIM 800-160 MG PO TABS: 1.0000 | ORAL_TABLET | Freq: Two times a day (BID) | ORAL | 0 refills | Status: AC

## 2023-08-19 NOTE — ED Provider Notes (Signed)
 [facility]    CSN: [id number] Arrival date & time: 08/19/23  0848      History   Chief Complaint Chief Complaint  Patient presents with   Back Pain   Emesis    HPI [name] [name] is a 38 y.o. female.   The patient reports an episode 3 weeks ago of some lower back pain, mild pain discharge, and dysuria but this resolved spontaneously.  She has not had any fevers, chills, constipation, diarrhea, abdominal pain, chest pain, rashes, difficulty breathing, pain with intercourse, or swelling of the lower extremities.  She has had some increased urgency that followed some pink discharge but no vaginal irritation, external lesions, or lower abdominal pain.  She denies history of renal stones but has an extensive history of renal calculi in her mother.  The history is provided by the patient.  Back Pain Associated symptoms: dysuria   Associated symptoms: no abdominal pain, no chest pain, no fever and no pelvic pain   Emesis Associated symptoms: no abdominal pain, no arthralgias, no chills, no diarrhea, no fever and no myalgias     History reviewed. No pertinent past medical history.  Patient Active Problem List   Diagnosis Date Noted   Encounter for general adult medical examination with abnormal findings 06/11/2023   Diabetes mellitus screening 06/11/2023   Encounter for lipid screening for cardiovascular disease 06/11/2023   Overweight 06/11/2023   Right upper quadrant abdominal pain 09/26/2020   Acute pain of right knee 09/26/2020   Sebaceous cyst 09/26/2020    Past Surgical History:  Procedure Laterality Date   BREAST BIOPSY Left 06/22/2023   US  LT BREAST BX W LOC DEV 1ST LESION IMG BX SPEC US  GUIDE 06/22/2023 [facility]   CERVICAL BIOPSY  W/ LOOP ELECTRODE EXCISION  2012   HERNIA REPAIR  1996    OB History   No obstetric history on file.      Home Medications    Prior to Admission medications   Medication Sig Start Date End Date Taking?  Authorizing Provider  levonorgestrel  (KYLEENA ) 19.5 MG IUD Kyleena  17.5 mcg/24 hrs (23yrs) 19.5mg  intrauterine device  Take 1 device by intrauterine route.   Yes [provider]  sulfamethoxazole-trimethoprim (BACTRIM DS) 800-160 MG tablet Take 1 tablet by mouth 2 (two) times daily for 7 days. 08/19/23 08/26/23 Yes Claybon Cuna, MD  Multiple Vitamins-Minerals (MULTIVITAMIN GUMMIES ADULT PO) Take 2 tablets by mouth daily.    [provider]    Family History Family History  Problem Relation Age of Onset   Hypertension Mother    Migraines Mother    Kidney Stones Mother    Kidney Stones Sister    Asthma Sister     Social History Social History   Tobacco Use   Smoking status: Never   Smokeless tobacco: Never  Vaping Use   Vaping status: Never Used  Substance Use Topics   Alcohol use: Yes    Comment: rare   Drug use: No     Allergies   Patient has no known allergies.   Review of Systems Review of Systems  Constitutional:  Negative for chills, fatigue and fever.  Respiratory:  Negative for shortness of breath.   Cardiovascular:  Negative for chest pain, palpitations and leg swelling.  Gastrointestinal:  Positive for vomiting (following back pain). Negative for abdominal distention, abdominal pain, blood in stool, constipation, diarrhea and nausea.  Genitourinary:  Positive for dysuria, frequency, hematuria (pink tint to the urine) and urgency.  Negative for decreased urine volume, difficulty urinating, enuresis, flank pain, pelvic pain, vaginal bleeding, vaginal discharge and vaginal pain.  Musculoskeletal:  Positive for back pain. Negative for arthralgias, joint swelling and myalgias.  Skin:  Negative for rash.  Neurological:  Negative for dizziness and light-headedness.     Physical Exam Triage Vital Signs ED Triage Vitals  Encounter Vitals Group     BP 08/19/23 0946 119/86     Girls Systolic BP Percentile --      Girls Diastolic BP Percentile  --      Boys Systolic BP Percentile --      Boys Diastolic BP Percentile --      Pulse Rate 08/19/23 0946 64     Resp 08/19/23 0946 16     Temp 08/19/23 0946 98.3 F (36.8 C)     Temp Source 08/19/23 0946 Oral     SpO2 08/19/23 0946 96 %     Weight 08/19/23 0946 185 lb (83.9 kg)     Height 08/19/23 0946 5' 9 (1.753 m)     Head Circumference --      Peak Flow --      Pain Score 08/19/23 0945 5     Pain Loc --      Pain Education --      Exclude from Growth Chart --    No data found.  Updated Vital Signs BP 119/86 (BP Location: Right Arm)   Pulse 64   Temp 98.3 F (36.8 C) (Oral)   Resp 16   Ht 5' 9 (1.753 m)   Wt 83.9 kg   LMP 07/22/2023 (Approximate)   SpO2 96%   BMI 27.32 kg/m   Visual Acuity Right Eye Distance:   Left Eye Distance:   Bilateral Distance:    Right Eye Near:   Left Eye Near:    Bilateral Near:     Physical Exam Vitals reviewed.  Constitutional:      General: She is not in acute distress.    Appearance: Normal appearance. She is normal weight. She is not ill-appearing, toxic-appearing or diaphoretic.  HENT:     Head: Normocephalic and atraumatic.   Eyes:     General: No scleral icterus.    Extraocular Movements: Extraocular movements intact.     Pupils: Pupils are equal, round, and reactive to light.    Cardiovascular:     Pulses: Normal pulses.  Pulmonary:     Effort: Pulmonary effort is normal.  Abdominal:     General: Abdomen is flat. There is no distension.     Palpations: Abdomen is soft.     Tenderness: There is no abdominal tenderness. There is no right CVA tenderness, left CVA tenderness, guarding or rebound.     Comments: Bowel sounds normal x 4   Musculoskeletal:        General: No swelling or tenderness.     Right lower leg: No edema.     Left lower leg: No edema.     Comments: Heeltap negative No tenderness to palpation or spasticity of the lumbar paraspinal musculature   Skin:    General: Skin is warm.      Capillary Refill: Capillary refill takes more than 3 seconds.     Coloration: Skin is not jaundiced.     Findings: No erythema or rash.   Neurological:     General: No focal deficit present.     Mental Status: She is alert.      UC Treatments / Results  Labs (all labs ordered are listed, but only abnormal results are displayed) Labs Reviewed  POCT URINALYSIS DIP (MANUAL ENTRY) - Abnormal; Notable for the following components:      Result Value   Color, UA orange (*)    Glucose, UA =250 (*)    Bilirubin, UA moderate (*)    Ketones, POC UA trace (5) (*)    Blood, UA moderate (*)    Protein Ur, POC =100 (*)    Urobilinogen, UA >=8.0 (*)    Nitrite, UA Positive (*)    Leukocytes, UA Large (3+) (*)    All other components within normal limits  POCT URINE PREGNANCY    EKG   Radiology No results found.  Procedures Procedures (including critical care time)  Medications Ordered in UC Medications - No data to display  Initial Impression / Assessment and Plan / UC Course  I have reviewed the triage vital signs and the nursing notes.  Pertinent labs & imaging results that were available during my care of the patient were reviewed by me and considered in my medical decision making (see chart for details).     Uncomplicated cystitis - The patient's urinalysis is consistent with urinary tract infection.  She does have some mild lumbar pain with nausea but no fevers, chills, or other systemic symptoms.  She does not have any CVA tenderness. - I discussed the treatment options with her.  Will start her on a 7-day course of Bactrim. - Cannot rule out renal calculus at this point.  I discussed monitoring for passage of any stones. - I recommended following up with her PCP within 1 week given her pain. - We also discussed symptoms to monitor for and return criteria. - The patient voiced understanding agreement the plan.  All questions were answered.   Final Clinical  Impressions(s) / UC Diagnoses   Final diagnoses:  Lower urinary tract infectious disease     Discharge Instructions      Take the antibiotics to completion with food to avoid nausea I recommend starting a probiotic as well Make sure to drink plenty of water If you develop fevers, chills, or increasing pain in your back go to the emergency room. Follow-up with your PCP within 1 week to evaluate further     ED Prescriptions     Medication Sig Dispense Auth. Provider   sulfamethoxazole-trimethoprim (BACTRIM DS) 800-160 MG tablet Take 1 tablet by mouth 2 (two) times daily for 7 days. 14 tablet Claybon Cuna, MD      PDMP not reviewed this encounter.   Claybon Cuna, MD 08/19/23 [phone]

## 2023-08-19 NOTE — ED Triage Notes (Addendum)
 Chief Complaint: low back pain and emesis. Having urinary urgency and slight pink in the urine.  Back pain ranging for 5-9/10.   Sick exposure: No  Onset: last night and once case 3 weeks ago  Prescriptions or OTC medications tried: Yes- tylenol , AZO Uti   with no relief  New foods, medications, or products: No  Recent Travel: Yes- [place], [place], and [place] 1 month ago .

## 2023-08-19 NOTE — Discharge Instructions (Addendum)
 Take the antibiotics to completion with food to avoid nausea I recommend starting a probiotic as well Make sure to drink plenty of water If you develop fevers, chills, or increasing pain in your back go to the emergency room. Follow-up with your PCP within 1 week to evaluate further

## 2023-08-23 ENCOUNTER — Encounter: Payer: Self-pay | Admitting: Nurse Practitioner

## 2023-08-25 ENCOUNTER — Ambulatory Visit: Admitting: Internal Medicine

## 2023-08-25 ENCOUNTER — Ambulatory Visit: Payer: Self-pay | Admitting: Internal Medicine

## 2023-08-25 VITALS — BP 116/68 | HR 78 | Temp 98.3°F | Ht 69.0 in | Wt 187.0 lb

## 2023-08-25 DIAGNOSIS — M2550 Pain in unspecified joint: Secondary | ICD-10-CM | POA: Diagnosis not present

## 2023-08-25 DIAGNOSIS — J029 Acute pharyngitis, unspecified: Secondary | ICD-10-CM

## 2023-08-25 DIAGNOSIS — R590 Localized enlarged lymph nodes: Secondary | ICD-10-CM | POA: Diagnosis not present

## 2023-08-25 DIAGNOSIS — N3001 Acute cystitis with hematuria: Secondary | ICD-10-CM | POA: Diagnosis not present

## 2023-08-25 LAB — POCT RAPID STREP A (OFFICE): Rapid Strep A Screen: NEGATIVE

## 2023-08-25 NOTE — Progress Notes (Signed)
 We gave you   Subjective:    Patient ID: [name] [name], female    DOB: January 12, 1986, 38 y.o.   MRN: [id number]      HPI [name] [name] is here for  Chief Complaint  Patient presents with   Follow-up    Urgent care follow up for UTI    UC  6/19 -diagnosed with UTI.  Finishes bactrim  tomorrow.    Symptoms improved.  He was advised to follow-up by the emergency room.  They were not able to rule out kidney stone and they wanted to make sure symptoms had completely resolved.  She denies any urinary symptoms now and denies any lower back pain.    Over past month has had intermittent cold symptoms.  She had an episode over the month ago that she did not feel well, had left lower back pain, nausea and vomiting.  3 weeks ago she had cold symptoms.  Her granddaughter did have hand-foot-and-mouth.  Her symptoms were fairly significant-significant sore throat, nasal congestion, cough, postnasal drip, subjective fever, achiness, headache and joint pain.  The first episode lasted 24 hours and she figured it was just a 24-hour bug.  The next week she had similar symptoms that lasted a full week.  They then resolved.  She started having symptoms again 2 days ago.  She still has some symptoms.   In between these episodes she denies any sore throat, rashes, joint pain, sinus pain or other cold symptoms.  She typically does not get sick and is unsure why she has been having the symptoms intermittently like this.      Medications and allergies reviewed with patient and updated if appropriate.  Current Outpatient Medications on File Prior to Visit  Medication Sig Dispense Refill   levonorgestrel  (KYLEENA ) 19.5 MG IUD Kyleena  17.5 mcg/24 hrs (38yrs) 19.5mg  intrauterine device  Take 1 device by intrauterine route.     Multiple Vitamins-Minerals (MULTIVITAMIN GUMMIES ADULT PO) Take 2 tablets by mouth daily.     sulfamethoxazole -trimethoprim  (BACTRIM  DS) 800-160 MG tablet Take 1 tablet by mouth 2 (two) times  daily for 7 days. 14 tablet 0   No current facility-administered medications on file prior to visit.    Review of Systems  Constitutional:  Positive for fever (subjective). Negative for appetite change, diaphoresis and unexpected weight change.  HENT:  Positive for congestion and sore throat. Negative for sinus pressure.   Respiratory:  Positive for cough. Negative for shortness of breath and wheezing.   Cardiovascular:  Negative for chest pain, palpitations and leg swelling.  Gastrointestinal:  Negative for abdominal pain, constipation, diarrhea and nausea.       No gerd  Genitourinary:  Negative for difficulty urinating, dysuria, frequency, hematuria and urgency.  Musculoskeletal:  Positive for arthralgias (achiness). Negative for back pain (lower back).  Neurological:  Positive for headaches (intermittent).       Objective:   Vitals:   08/25/23 1551  BP: 116/68  Pulse: 78  Temp: 98.3 F (36.8 C)  SpO2: 98%   BP Readings from Last 3 Encounters:  08/25/23 116/68  08/19/23 119/86  06/11/23 104/72   Wt Readings from Last 3 Encounters:  08/25/23 187 lb (84.8 kg)  08/19/23 185 lb (83.9 kg)  06/11/23 190 lb 8 oz (86.4 kg)   Body mass index is 27.62 kg/m.    Physical Exam Constitutional:      General: She is not in acute distress.    Appearance: Normal appearance. She is not ill-appearing.  HENT:  Head: Normocephalic and atraumatic.     Right Ear: Tympanic membrane, ear canal and external ear normal.     Left Ear: Tympanic membrane, ear canal and external ear normal.     Mouth/Throat:     Mouth: Mucous membranes are moist.     Pharynx: No oropharyngeal exudate or posterior oropharyngeal erythema.   Eyes:     Conjunctiva/sclera: Conjunctivae normal.   Neck:     Comments: Bilateral tender posterior and anterior cervical lymphadenopathy, positive submandibular lymphadenopathy.  No occipital lymphadenopathy Cardiovascular:     Rate and Rhythm: Normal rate and  regular rhythm.  Pulmonary:     Effort: Pulmonary effort is normal. No respiratory distress.     Breath sounds: Normal breath sounds. No wheezing or rales.  Abdominal:     General: There is no distension.     Palpations: Abdomen is soft.     Tenderness: There is no abdominal tenderness.   Musculoskeletal:        General: No swelling or deformity.     Cervical back: Neck supple. No tenderness.  Lymphadenopathy:     Cervical: Cervical adenopathy present.   Skin:    General: Skin is warm and dry.     Findings: No rash.   Neurological:     Mental Status: She is alert.            Assessment & Plan:    Acute cystitis: UA at urgent care 6/19 consistent with UTI Culture not sent Complete 7 days of Bactrim  tomorrow-advised to complete last day of Bactrim  DS twice daily Urinary symptoms have resolved, no lower back pain Clinically successfully treated Will check UA, urine culture to confirm  Cervical lymphadenopathy, recurrent cold symptoms, sore throat, joint pain: For the past 6 weeks or so she has had at least 3 episodes of cold symptoms that have resolved and recurred-this is very unusual for her Has also had associated joint pain On exam has posterior, anterior cervical lymphadenopathy and submandibular lymphadenopathy. Rapid strep here negative Check CBC, CMP, ANA, ESR, CRP Further evaluation depending on results

## 2023-08-25 NOTE — Patient Instructions (Addendum)
      Blood work, urine tests were ordered.       Medications changes include :   None     Return if symptoms worsen or fail to improve.

## 2023-08-26 LAB — CBC WITH DIFFERENTIAL/PLATELET
Basophils Absolute: 0 10*3/uL (ref 0.0–0.1)
Basophils Relative: 1.2 % (ref 0.0–3.0)
Eosinophils Absolute: 0.2 10*3/uL (ref 0.0–0.7)
Eosinophils Relative: 8 % — ABNORMAL HIGH (ref 0.0–5.0)
HCT: 38.3 % (ref 36.0–46.0)
Hemoglobin: 13 g/dL (ref 12.0–15.0)
Lymphocytes Relative: 30.5 % (ref 12.0–46.0)
Lymphs Abs: 0.9 10*3/uL (ref 0.7–4.0)
MCHC: 33.8 g/dL (ref 30.0–36.0)
MCV: 93 fl (ref 78.0–100.0)
Monocytes Absolute: 0.5 10*3/uL (ref 0.1–1.0)
Monocytes Relative: 17.2 % — ABNORMAL HIGH (ref 3.0–12.0)
Neutro Abs: 1.3 10*3/uL — ABNORMAL LOW (ref 1.4–7.7)
Neutrophils Relative %: 43.1 % (ref 43.0–77.0)
Platelets: 163 10*3/uL (ref 150.0–400.0)
RBC: 4.12 Mil/uL (ref 3.87–5.11)
RDW: 13.1 % (ref 11.5–15.5)
WBC: 2.9 10*3/uL — ABNORMAL LOW (ref 4.0–10.5)

## 2023-08-26 LAB — URINALYSIS, ROUTINE W REFLEX MICROSCOPIC
Bilirubin Urine: NEGATIVE
Ketones, ur: NEGATIVE
Leukocytes,Ua: NEGATIVE
Nitrite: NEGATIVE
Specific Gravity, Urine: <=1.005 — AB (ref 1.000–1.030)
Total Protein, Urine: NEGATIVE
Urine Glucose: NEGATIVE
Urobilinogen, UA: 0.2 (ref 0.0–1.0)
WBC, UA: NONE SEEN (ref 0–?)
pH: 6 (ref 5.0–8.0)

## 2023-08-26 LAB — COMPREHENSIVE METABOLIC PANEL WITH GFR
ALT: 15 U/L (ref 0–35)
AST: 20 U/L (ref 0–37)
Albumin: 4.3 g/dL (ref 3.5–5.2)
Alkaline Phosphatase: 64 U/L (ref 39–117)
BUN: 13 mg/dL (ref 6–23)
CO2: 25 meq/L (ref 19–32)
Calcium: 9.3 mg/dL (ref 8.4–10.5)
Chloride: 101 meq/L (ref 96–112)
Creatinine, Ser: 1.15 mg/dL (ref 0.40–1.20)
GFR: 61.11 mL/min (ref >60.00)
Glucose, Bld: 79 mg/dL (ref 70–99)
Potassium: 4.7 meq/L (ref 3.5–5.1)
Sodium: 134 meq/L — ABNORMAL LOW (ref 135–145)
Total Bilirubin: 0.4 mg/dL (ref 0.2–1.2)
Total Protein: 7.9 g/dL (ref 6.0–8.3)

## 2023-08-26 LAB — C-REACTIVE PROTEIN: CRP: <1 mg/dL (ref 0.5–20.0)

## 2023-08-26 LAB — SEDIMENTATION RATE: Sed Rate: 30 mm/h — ABNORMAL HIGH (ref 0–20)

## 2023-08-27 NOTE — Telephone Encounter (Signed)
 Copied from CRM [id number]. Topic: Clinical - Lab/Test Results >> Aug 27, 2023 10:18 AM [name] P wrote: Reason for CRM: Patient called for an update on labs, please follow up

## 2023-08-28 LAB — ANTI-NUCLEAR AB-TITER (ANA TITER): ANA Titer 1: 1:80 {titer} — ABNORMAL HIGH

## 2023-08-28 LAB — URINE CULTURE: Result:: NO GROWTH

## 2023-08-28 LAB — ANA: Anti Nuclear Antibody (ANA): POSITIVE — AB
